# Patient Record
Sex: Male | Born: 1985 | Race: White | Hispanic: No | Marital: Married | Smoking: Former smoker
Health system: Southern US, Community
[De-identification: ages and names within clinical notes are randomized; demographics above are authoritative.]

## PROBLEM LIST (undated history)

## (undated) DIAGNOSIS — K219 Gastro-esophageal reflux disease without esophagitis: Secondary | ICD-10-CM

## (undated) HISTORY — PX: TONSILLECTOMY: SUR1361

---
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
## 2008-07-04 ENCOUNTER — Emergency Department (HOSPITAL_COMMUNITY): Admission: EM | Admit: 2008-07-04 | Discharge: 2008-07-04 | Payer: Self-pay | Admitting: Emergency Medicine

## 2008-12-04 ENCOUNTER — Emergency Department (HOSPITAL_COMMUNITY): Admission: EM | Admit: 2008-12-04 | Discharge: 2008-12-04 | Payer: Self-pay | Admitting: Emergency Medicine

## 2010-08-21 ENCOUNTER — Emergency Department (HOSPITAL_COMMUNITY)
Admission: EM | Admit: 2010-08-21 | Discharge: 2010-08-21 | Disposition: A | Discharge status: Other Acute Inpatient Hospital | Payer: Self-pay | Source: Home / Self Care | Admitting: Emergency Medicine

## 2010-08-21 ENCOUNTER — Inpatient Hospital Stay (HOSPITAL_COMMUNITY)
Admission: AD | Admit: 2010-08-21 | Discharge: 2010-08-26 | Payer: Self-pay | Source: Home / Self Care | Attending: Psychiatry | Admitting: Psychiatry

## 2010-10-01 ENCOUNTER — Emergency Department (HOSPITAL_COMMUNITY)
Admission: EM | Admit: 2010-10-01 | Discharge: 2010-10-01 | Discharge status: Against Medical Advice | Payer: Self-pay | Attending: Emergency Medicine | Admitting: Emergency Medicine

## 2010-10-01 ENCOUNTER — Emergency Department (HOSPITAL_COMMUNITY): Admit: 2010-10-01 | Payer: Self-pay

## 2010-10-01 DIAGNOSIS — T07XXXA Unspecified multiple injuries, initial encounter: Secondary | ICD-10-CM | POA: Insufficient documentation

## 2010-10-01 DIAGNOSIS — Y92009 Unspecified place in unspecified non-institutional (private) residence as the place of occurrence of the external cause: Secondary | ICD-10-CM | POA: Insufficient documentation

## 2010-10-01 DIAGNOSIS — R569 Unspecified convulsions: Secondary | ICD-10-CM | POA: Insufficient documentation

## 2010-10-01 DIAGNOSIS — W108XXA Fall (on) (from) other stairs and steps, initial encounter: Secondary | ICD-10-CM | POA: Insufficient documentation

## 2010-10-16 ENCOUNTER — Emergency Department (HOSPITAL_COMMUNITY)
Admission: EM | Admit: 2010-10-16 | Discharge: 2010-10-16 | Disposition: A | Discharge status: Home / Self Care | Payer: Self-pay | Attending: Emergency Medicine | Admitting: Emergency Medicine

## 2010-10-16 ENCOUNTER — Emergency Department (HOSPITAL_COMMUNITY): Payer: Self-pay

## 2010-10-16 DIAGNOSIS — S81009A Unspecified open wound, unspecified knee, initial encounter: Secondary | ICD-10-CM | POA: Insufficient documentation

## 2010-10-16 DIAGNOSIS — Y929 Unspecified place or not applicable: Secondary | ICD-10-CM | POA: Insufficient documentation

## 2010-10-16 DIAGNOSIS — X58XXXA Exposure to other specified factors, initial encounter: Secondary | ICD-10-CM | POA: Insufficient documentation

## 2010-10-16 DIAGNOSIS — S81809A Unspecified open wound, unspecified lower leg, initial encounter: Secondary | ICD-10-CM | POA: Insufficient documentation

## 2010-10-16 DIAGNOSIS — J45909 Unspecified asthma, uncomplicated: Secondary | ICD-10-CM | POA: Insufficient documentation

## 2010-10-16 DIAGNOSIS — L03119 Cellulitis of unspecified part of limb: Secondary | ICD-10-CM | POA: Insufficient documentation

## 2010-10-16 DIAGNOSIS — F319 Bipolar disorder, unspecified: Secondary | ICD-10-CM | POA: Insufficient documentation

## 2010-10-16 DIAGNOSIS — F411 Generalized anxiety disorder: Secondary | ICD-10-CM | POA: Insufficient documentation

## 2010-10-16 DIAGNOSIS — L02419 Cutaneous abscess of limb, unspecified: Secondary | ICD-10-CM | POA: Insufficient documentation

## 2010-10-16 DIAGNOSIS — G40909 Epilepsy, unspecified, not intractable, without status epilepticus: Secondary | ICD-10-CM | POA: Insufficient documentation

## 2010-10-16 LAB — CBC
HCT: 39.1 % (ref 39.0–52.0)
MCH: 31.3 pg (ref 26.0–34.0)
MCHC: 34.3 g/dL (ref 30.0–36.0)
MCV: 91.4 fL (ref 78.0–100.0)
Platelets: 326 10*3/uL (ref 150–400)
RDW: 12.8 % (ref 11.5–15.5)

## 2010-10-16 LAB — BASIC METABOLIC PANEL
BUN: 15 mg/dL (ref 6–23)
Creatinine, Ser: 0.95 mg/dL (ref 0.4–1.5)
GFR calc non Af Amer: >60 mL/min (ref >60)
Glucose, Bld: 85 mg/dL (ref 70–99)

## 2010-10-16 LAB — DIFFERENTIAL
Eosinophils Absolute: 0.1 10*3/uL (ref 0.0–0.7)
Eosinophils Relative: 1 % (ref 0–5)
Lymphs Abs: 2.3 10*3/uL (ref 0.7–4.0)
Monocytes Absolute: 1.1 10*3/uL — ABNORMAL HIGH (ref 0.1–1.0)
Monocytes Relative: 10 % (ref 3–12)

## 2010-11-09 LAB — COMPREHENSIVE METABOLIC PANEL
ALT: 28 U/L (ref 0–53)
AST: 35 U/L (ref 0–37)
Alkaline Phosphatase: 56 U/L (ref 39–117)
Calcium: 9.1 mg/dL (ref 8.4–10.5)
GFR calc Af Amer: >60 mL/min (ref >60)
Glucose, Bld: 86 mg/dL (ref 70–99)
Potassium: 3.6 mEq/L (ref 3.5–5.1)
Sodium: 139 mEq/L (ref 135–145)
Total Protein: 7.2 g/dL (ref 6.0–8.3)

## 2010-11-09 LAB — CBC
HCT: 41.8 % (ref 39.0–52.0)
Hemoglobin: 14.9 g/dL (ref 13.0–17.0)
MCHC: 35.6 g/dL (ref 30.0–36.0)
RDW: 12.6 % (ref 11.5–15.5)
WBC: 5.5 10*3/uL (ref 4.0–10.5)

## 2010-11-09 LAB — DIFFERENTIAL
Basophils Relative: 1 % (ref 0–1)
Eosinophils Absolute: 0.1 10*3/uL (ref 0.0–0.7)
Eosinophils Relative: 1 % (ref 0–5)
Lymphs Abs: 1.8 10*3/uL (ref 0.7–4.0)
Monocytes Absolute: 0.7 10*3/uL (ref 0.1–1.0)
Monocytes Relative: 12 % (ref 3–12)
Neutrophils Relative %: 53 % (ref 43–77)

## 2010-11-09 LAB — ETHANOL: Alcohol, Ethyl (B): <5 mg/dL (ref 0–10)

## 2010-11-09 LAB — RAPID URINE DRUG SCREEN, HOSP PERFORMED: Barbiturates: NOT DETECTED

## 2010-11-09 LAB — ACETAMINOPHEN LEVEL: Acetaminophen (Tylenol), Serum: <10 ug/mL — ABNORMAL LOW (ref 10–30)

## 2010-12-09 LAB — CBC
HCT: 47.8 % (ref 39.0–52.0)
Hemoglobin: 16.6 g/dL (ref 13.0–17.0)
RBC: 5.07 MIL/uL (ref 4.22–5.81)
RDW: 13.2 % (ref 11.5–15.5)
WBC: 17.4 10*3/uL — ABNORMAL HIGH (ref 4.0–10.5)

## 2010-12-09 LAB — URINALYSIS, ROUTINE W REFLEX MICROSCOPIC
Bilirubin Urine: NEGATIVE
Glucose, UA: NEGATIVE mg/dL
Hgb urine dipstick: NEGATIVE
Leukocytes, UA: NEGATIVE
Protein, ur: 100 mg/dL — AB
pH: 8 (ref 5.0–8.0)

## 2010-12-09 LAB — DIFFERENTIAL
Basophils Absolute: 0 10*3/uL (ref 0.0–0.1)
Eosinophils Relative: 0 % (ref 0–5)
Lymphocytes Relative: 2 % — ABNORMAL LOW (ref 12–46)
Lymphs Abs: 0.3 10*3/uL — ABNORMAL LOW (ref 0.7–4.0)
Monocytes Absolute: 0.9 10*3/uL (ref 0.1–1.0)
Neutro Abs: 16 10*3/uL — ABNORMAL HIGH (ref 1.7–7.7)

## 2010-12-09 LAB — COMPREHENSIVE METABOLIC PANEL
AST: 22 U/L (ref 0–37)
Albumin: 4 g/dL (ref 3.5–5.2)
Alkaline Phosphatase: 63 U/L (ref 39–117)
BUN: 15 mg/dL (ref 6–23)
CO2: 31 mEq/L (ref 19–32)
Chloride: 102 mEq/L (ref 96–112)
GFR calc non Af Amer: >60 mL/min (ref >60)
Potassium: 3.9 mEq/L (ref 3.5–5.1)
Total Bilirubin: 0.7 mg/dL (ref 0.3–1.2)

## 2010-12-09 LAB — URINE MICROSCOPIC-ADD ON

## 2011-02-03 ENCOUNTER — Emergency Department (HOSPITAL_COMMUNITY)
Admission: EM | Admit: 2011-02-03 | Discharge: 2011-02-03 | Disposition: A | Discharge status: Home / Self Care | Payer: Self-pay | Attending: Emergency Medicine | Admitting: Emergency Medicine

## 2011-02-03 DIAGNOSIS — R05 Cough: Secondary | ICD-10-CM | POA: Insufficient documentation

## 2011-02-03 DIAGNOSIS — J069 Acute upper respiratory infection, unspecified: Secondary | ICD-10-CM | POA: Insufficient documentation

## 2011-02-03 DIAGNOSIS — R059 Cough, unspecified: Secondary | ICD-10-CM | POA: Insufficient documentation

## 2011-02-03 DIAGNOSIS — R51 Headache: Secondary | ICD-10-CM | POA: Insufficient documentation

## 2011-02-03 DIAGNOSIS — B9789 Other viral agents as the cause of diseases classified elsewhere: Secondary | ICD-10-CM | POA: Insufficient documentation

## 2011-02-03 LAB — RAPID STREP SCREEN (MED CTR MEBANE ONLY): Streptococcus, Group A Screen (Direct): NEGATIVE

## 2011-02-05 ENCOUNTER — Emergency Department (HOSPITAL_COMMUNITY)
Admission: EM | Admit: 2011-02-05 | Discharge: 2011-02-05 | Disposition: A | Discharge status: Home / Self Care | Payer: Self-pay | Attending: Emergency Medicine | Admitting: Emergency Medicine

## 2011-02-05 DIAGNOSIS — R05 Cough: Secondary | ICD-10-CM | POA: Insufficient documentation

## 2011-02-05 DIAGNOSIS — R6889 Other general symptoms and signs: Secondary | ICD-10-CM | POA: Insufficient documentation

## 2011-02-05 DIAGNOSIS — J029 Acute pharyngitis, unspecified: Secondary | ICD-10-CM | POA: Insufficient documentation

## 2011-02-05 DIAGNOSIS — R059 Cough, unspecified: Secondary | ICD-10-CM | POA: Insufficient documentation

## 2011-03-16 ENCOUNTER — Emergency Department (HOSPITAL_COMMUNITY): Payer: Self-pay

## 2011-03-16 ENCOUNTER — Emergency Department (HOSPITAL_COMMUNITY)
Admission: EM | Admit: 2011-03-16 | Discharge: 2011-03-16 | Disposition: A | Discharge status: Home / Self Care | Payer: Self-pay | Attending: Emergency Medicine | Admitting: Emergency Medicine

## 2011-03-16 ENCOUNTER — Other Ambulatory Visit: Payer: Self-pay

## 2011-03-16 DIAGNOSIS — R112 Nausea with vomiting, unspecified: Secondary | ICD-10-CM | POA: Insufficient documentation

## 2011-03-16 DIAGNOSIS — R079 Chest pain, unspecified: Secondary | ICD-10-CM | POA: Insufficient documentation

## 2011-03-16 DIAGNOSIS — R0602 Shortness of breath: Secondary | ICD-10-CM | POA: Insufficient documentation

## 2011-03-16 LAB — CBC
Hemoglobin: 13.8 g/dL (ref 13.0–17.0)
MCH: 31.9 pg (ref 26.0–34.0)
MCV: 91.2 fL (ref 78.0–100.0)
RBC: 4.32 MIL/uL (ref 4.22–5.81)

## 2011-03-16 MED ORDER — ALUM & MAG HYDROXIDE-SIMETH 200-200-20 MG/5ML PO SUSP
30.0000 mL | Freq: Once | ORAL | Status: AC
  Administered 2011-03-16: 30 mL via ORAL
  Filled 2011-03-16: qty 30

## 2011-03-16 NOTE — ED Provider Notes (Signed)
History     Chief Complaint  Patient presents with  . Chest Pain   Patient is a 25 y.o. male presenting with chest pain. The history is provided by the patient. No language interpreter was used.  Chest Pain Episode onset: 1.5 weeks ago. Chest pain occurs constantly. The chest pain is unchanged. Associated with: nothing. The severity of the pain is mild. The quality of the pain is described as pressure-like. The pain does not radiate. Exacerbated by: nothing. Primary symptoms include shortness of breath, nausea and vomiting. Pertinent negatives for primary symptoms include no fever and no abdominal pain.  Pertinent negatives for associated symptoms include no diaphoresis, no lower extremity edema and no numbness. Associated symptoms comments: Tingling of LUE. He tried nothing for the symptoms. Risk factors: Heart disease and MI in family before age 76.   His family medical history is significant for heart disease in family and early MI in family.   C/o left sided chest pain onset 1.5 weeks ago while walking with a friend and persistent since with associated SOB and tingling in LUE with onset of chest pain which resolved after 1 day. Describes chest pain as like pressure. States SOB is worse with movement/exertion. Also states he has been vomiting upon awaking for the last 2 weeks with episodes lasting approximately 30 minutes and then resolved. Denies previous history of similar symptoms. Denies smoking. Notes family history of MI and heart disease before age 62.  Patient reports he hasn't been seen by a physician in "a while".   Patient seen at 12:51PM History reviewed. No pertinent past medical history.  History reviewed. No pertinent past surgical history.  No family history on file.  History  Substance Use Topics  . Smoking status: Not on file  . Smokeless tobacco: Not on file  . Alcohol Use: No      Review of Systems  Constitutional: Negative for fever, chills and diaphoresis.    Respiratory: Positive for shortness of breath.   Cardiovascular: Positive for chest pain.  Gastrointestinal: Positive for nausea and vomiting. Negative for abdominal pain.  Neurological: Negative for numbness.  All other systems reviewed and are negative.  All other systems negative except as noted in HPI.    Physical Exam  BP 118/67  Pulse 55  Temp(Src) 98.9 F (37.2 C) (Oral)  Resp 18  Ht 7\' 2"  (2.184 m)  Wt 220 lb (99.791 kg)  BMI 20.91 kg/m2  SpO2 97%  Physical Exam  Nursing note and vitals reviewed. Constitutional: He is oriented to person, place, and time. He appears well-developed and well-nourished. No distress.       Bradycardic.   HENT:  Head: Normocephalic and atraumatic.  Eyes: Conjunctivae are normal. No scleral icterus.  Neck: Normal range of motion. Neck supple.  Cardiovascular: Normal rate, regular rhythm, normal heart sounds, intact distal pulses and normal pulses.  Exam reveals no gallop and no friction rub.   No murmur heard. Pulmonary/Chest: Effort normal and breath sounds normal. He has no wheezes.  Abdominal: Soft. He exhibits no distension. There is no tenderness.  Musculoskeletal: Normal range of motion. He exhibits no edema and no tenderness.  Neurological: He is alert and oriented to person, place, and time. No sensory deficit.  Skin: Skin is warm and dry.  Psychiatric: He has a normal mood and affect. His behavior is normal.    ED Course  Procedures  MDM Constant CP x 1 week. Does have some risk factors. ecg with early repol. Labs  and cxr pending. Suspect noncardiac cp   Date: 03/16/2011  Rate: 55  Rhythm: normal sinus rhythm  QRS Axis: normal  Intervals: normal  ST/T Wave abnormalities: early repolarization  Conduction Disutrbances:none  Narrative Interpretation:   Old EKG Reviewed: unchanged      Chart written by Clarita Crane acting as scribe for Dr. Patria Mane I personally performed the services described in this documentation,  which was scribed in my presence. The recorded information has been reviewed and considered. Results for orders placed during the hospital encounter of 03/16/11  TROPONIN I      Component Value Range   Troponin I <0.30  <0.30 (ng/mL)  CBC      Component Value Range   WBC 4.9  4.0 - 10.5 (K/uL)   RBC 4.32  4.22 - 5.81 (MIL/uL)   Hemoglobin 13.8  13.0 - 17.0 (g/dL)   HCT 11.9  14.7 - 82.9 (%)   MCV 91.2  78.0 - 100.0 (fL)   MCH 31.9  26.0 - 34.0 (pg)   MCHC 35.0  30.0 - 36.0 (g/dL)   RDW 56.2  13.0 - 86.5 (%)   Platelets 236  150 - 400 (K/uL)     Lyanne Co, MD 03/16/11 1435

## 2011-03-16 NOTE — ED Notes (Signed)
Pt sleeping. 

## 2011-03-16 NOTE — ED Notes (Signed)
Pt states  He has had chest pain for weeks. Also weakness. States he is sleeping a lot also.

## 2011-06-07 ENCOUNTER — Encounter (HOSPITAL_COMMUNITY): Payer: Self-pay | Admitting: *Deleted

## 2011-06-07 ENCOUNTER — Emergency Department (HOSPITAL_COMMUNITY)
Admission: EM | Admit: 2011-06-07 | Discharge: 2011-06-07 | Disposition: A | Discharge status: Other Acute Inpatient Hospital | Payer: Self-pay | Attending: Emergency Medicine | Admitting: Emergency Medicine

## 2011-06-07 DIAGNOSIS — T43502A Poisoning by unspecified antipsychotics and neuroleptics, intentional self-harm, initial encounter: Secondary | ICD-10-CM | POA: Insufficient documentation

## 2011-06-07 DIAGNOSIS — T438X2A Poisoning by other psychotropic drugs, intentional self-harm, initial encounter: Secondary | ICD-10-CM | POA: Insufficient documentation

## 2011-06-07 DIAGNOSIS — T43501A Poisoning by unspecified antipsychotics and neuroleptics, accidental (unintentional), initial encounter: Secondary | ICD-10-CM | POA: Insufficient documentation

## 2011-06-07 DIAGNOSIS — T50901A Poisoning by unspecified drugs, medicaments and biological substances, accidental (unintentional), initial encounter: Secondary | ICD-10-CM

## 2011-06-07 LAB — RAPID URINE DRUG SCREEN, HOSP PERFORMED
Amphetamines: NOT DETECTED
Barbiturates: NOT DETECTED
Tetrahydrocannabinol: POSITIVE — AB

## 2011-06-07 LAB — CBC
MCH: 31.8 pg (ref 26.0–34.0)
Platelets: 210 10*3/uL (ref 150–400)
RBC: 4.31 MIL/uL (ref 4.22–5.81)
WBC: 5.5 10*3/uL (ref 4.0–10.5)

## 2011-06-07 LAB — DIFFERENTIAL
Eosinophils Absolute: 0.1 10*3/uL (ref 0.0–0.7)
Lymphocytes Relative: 32 % (ref 12–46)
Lymphs Abs: 1.8 10*3/uL (ref 0.7–4.0)
Neutro Abs: 3 10*3/uL (ref 1.7–7.7)
Neutrophils Relative %: 56 % (ref 43–77)

## 2011-06-07 LAB — URINALYSIS, ROUTINE W REFLEX MICROSCOPIC
Glucose, UA: NEGATIVE mg/dL
Leukocytes, UA: NEGATIVE
Protein, ur: NEGATIVE mg/dL
Urobilinogen, UA: 0.2 mg/dL (ref 0.0–1.0)

## 2011-06-07 LAB — BASIC METABOLIC PANEL
Chloride: 104 mEq/L (ref 96–112)
GFR calc non Af Amer: >90 mL/min (ref >90)
Glucose, Bld: 80 mg/dL (ref 70–99)
Potassium: 3.2 mEq/L — ABNORMAL LOW (ref 3.5–5.1)
Sodium: 139 mEq/L (ref 135–145)

## 2011-06-07 LAB — SALICYLATE LEVEL: Salicylate Lvl: <2 mg/dL — ABNORMAL LOW (ref 2.8–20.0)

## 2011-06-07 LAB — HEPATIC FUNCTION PANEL
Albumin: 3.9 g/dL (ref 3.5–5.2)
Bilirubin, Direct: 0.1 mg/dL (ref 0.0–0.3)
Total Bilirubin: 0.4 mg/dL (ref 0.3–1.2)

## 2011-06-07 LAB — ACETAMINOPHEN LEVEL: Acetaminophen (Tylenol), Serum: <15 ug/mL (ref 10–30)

## 2011-06-07 MED ORDER — POTASSIUM CHLORIDE CRYS ER 20 MEQ PO TBCR
60.0000 meq | EXTENDED_RELEASE_TABLET | Freq: Once | ORAL | Status: AC
  Administered 2011-06-07: 60 meq via ORAL
  Filled 2011-06-07: qty 3

## 2011-06-07 NOTE — ED Provider Notes (Signed)
12:15 PM patient alert and Lipitor Glasgow Coma Score 15 cooperative not lightheaded on standing  Doug Sou, MD 06/07/11 1235

## 2011-06-07 NOTE — ED Notes (Signed)
CALLED POISON CONTROL AND WAS INFORMED TO  ADD LFTS AND  MG LEVEL.  SUPPORTIVE CARE aND TO KEEP PATIENT ON MONITOR

## 2011-06-07 NOTE — ED Provider Notes (Addendum)
History     CSN: [id number] Arrival date & time: 06/07/2011  2:05 AM  Chief Complaint  Patient presents with  . Drug Overdose    (Consider location/radiation/quality/duration/timing/severity/associated sxs/prior treatment) HPI Comments: Seen 0209. Patient states he had an argument with his girlfriend a week ago and she moved out with his child. He has been depressed since. Went to his cousin's house and took an unknown number of pills in a bottle labeled resperdal. The medicine had belonged to a former roommate of his cousin. He states it was an attempt to kill himself. He has a h/o depression and has been under the care of [facility] in the past.  Patient is a 25 y.o. male presenting with Overdose. The history is provided by the patient (Patient despondent over recent break up with his girllfirend who took his child and moved out).  Drug Overdose This is a new (Took an unknow number of resperidal within the last 2-3 hours) problem. The current episode started 3 to 5 hours ago. Pertinent negatives include no chest pain, no abdominal pain, no headaches and no shortness of breath.    History reviewed. No pertinent past medical history.  History reviewed. No pertinent past surgical history.  No family history on file.  History  Substance Use Topics  . Smoking status: Not on file  . Smokeless tobacco: Not on file  . Alcohol Use: No      Review of Systems  Respiratory: Negative for shortness of breath.   Cardiovascular: Negative for chest pain.  Gastrointestinal: Negative for abdominal pain.  Neurological: Negative for headaches.  Psychiatric/Behavioral: Positive for suicidal ideas.       Depression  All other systems reviewed and are negative.    Allergies  Amoxapine and related; Penicillins; and Sulfa antibiotics  Home Medications  No current outpatient prescriptions on file.  BP 110/54  Pulse 84  Temp(Src) 97.6 F (36.4 C) (Oral)  Ht 6' (1.829 m)  Wt 220 lb (99.791  kg)  BMI 29.84 kg/m2  SpO2 96%  Physical Exam  Nursing note and vitals reviewed. Constitutional: He is oriented to person, place, and time. He appears well-developed and well-nourished.       Sleepy but able to answer all questions  HENT:  Head: Normocephalic and atraumatic.  Right Ear: External ear normal.  Left Ear: External ear normal.  Mouth/Throat: Oropharynx is clear and moist.  Eyes: EOM are normal. Pupils are equal, round, and reactive to light.  Neck: Normal range of motion. Neck supple.  Cardiovascular: Normal rate, normal heart sounds and intact distal pulses.   Pulmonary/Chest: Effort normal and breath sounds normal.  Abdominal: Soft.  Musculoskeletal: Normal range of motion.  Neurological: He is alert and oriented to person, place, and time. He has normal reflexes. No cranial nerve deficit. Coordination normal.  Skin: Skin is warm and dry.  Psychiatric: His speech is normal. Cognition and memory are normal. He expresses impulsivity. He exhibits a depressed mood. He expresses suicidal ideation. He expresses suicidal plans.       Flat affect.     ED Course  CRITICAL CARE Performed by: Annamarie Dawley Authorized by: Annamarie Dawley Total critical care time: 65 minutes Critical care was necessary to treat or prevent imminent or life-threatening deterioration of the following conditions: metabolic crisis. Critical care was time spent personally by me on the following activities: development of treatment plan with patient or surrogate, discussions with consultants, evaluation of patient's response to treatment, examination of patient, obtaining  history from patient or surrogate, ordering and performing treatments and interventions, ordering and review of laboratory studies, pulse oximetry, re-evaluation of patient's condition and review of old charts.   (including critical care time)  Labs Reviewed  URINE RAPID DRUG SCREEN (HOSP PERFORMED) - Abnormal; Notable for the  following:    Tetrahydrocannabinol POSITIVE (*)    All other components within normal limits  CBC  DIFFERENTIAL  BASIC METABOLIC PANEL  ETHANOL  SALICYLATE LEVEL  ACETAMINOPHEN LEVEL  MAGNESIUM  HEPATIC FUNCTION PANEL   Results for orders placed during the hospital encounter of 06/07/11  URINE RAPID DRUG SCREEN (HOSP PERFORMED)      Component Value Range   Opiates NONE DETECTED  NONE DETECTED    Cocaine NONE DETECTED  NONE DETECTED    Benzodiazepines NONE DETECTED  NONE DETECTED    Amphetamines NONE DETECTED  NONE DETECTED    Tetrahydrocannabinol POSITIVE (*) NONE DETECTED    Barbiturates NONE DETECTED  NONE DETECTED   CBC      Component Value Range   WBC 5.5  4.0 - 10.5 (K/uL)   RBC 4.31  4.22 - 5.81 (MIL/uL)   Hemoglobin 13.7  13.0 - 17.0 (g/dL)   HCT 13.0  86.5 - 78.4 (%)   MCV 91.4  78.0 - 100.0 (fL)   MCH 31.8  26.0 - 34.0 (pg)   MCHC 34.8  30.0 - 36.0 (g/dL)   RDW 69.6  29.5 - 28.4 (%)   Platelets 210  150 - 400 (K/uL)  DIFFERENTIAL      Component Value Range   Neutrophils Relative 56  43 - 77 (%)   Neutro Abs 3.0  1.7 - 7.7 (K/uL)   Lymphocytes Relative 32  12 - 46 (%)   Lymphs Abs 1.8  0.7 - 4.0 (K/uL)   Monocytes Relative 11  3 - 12 (%)   Monocytes Absolute 0.6  0.1 - 1.0 (K/uL)   Eosinophils Relative 1  0 - 5 (%)   Eosinophils Absolute 0.1  0.0 - 0.7 (K/uL)   Basophils Relative 0  0 - 1 (%)   Basophils Absolute 0.0  0.0 - 0.1 (K/uL)  BASIC METABOLIC PANEL      Component Value Range   Sodium 139  135 - 145 (mEq/L)   Potassium 3.2 (*) 3.5 - 5.1 (mEq/L)   Chloride 104  96 - 112 (mEq/L)   CO2 25  19 - 32 (mEq/L)   Glucose, Bld 80  70 - 99 (mg/dL)   BUN 11  6 - 23 (mg/dL)   Creatinine, Ser 1.32  0.50 - 1.35 (mg/dL)   Calcium 9.1  8.4 - 44.0 (mg/dL)   GFR calc non Af Amer >90  >90 (mL/min)   GFR calc Af Amer >90  >90 (mL/min)  ETHANOL      Component Value Range   Alcohol, Ethyl (B) 27 (*) 0 - 11 (mg/dL)  SALICYLATE LEVEL      Component Value Range    Salicylate Lvl <2.0 (*) 2.8 - 20.0 (mg/dL)  ACETAMINOPHEN LEVEL      Component Value Range   Acetaminophen (Tylenol), Serum <15.0  10 - 30 (ug/mL)  MAGNESIUM      Component Value Range   Magnesium 2.3  1.5 - 2.5 (mg/dL)  HEPATIC FUNCTION PANEL      Component Value Range   Total Protein 6.7  6.0 - 8.3 (g/dL)   Albumin 3.9  3.5 - 5.2 (g/dL)   AST 20  0 - 37 (U/L)  ALT 13  0 - 53 (U/L)   Alkaline Phosphatase 57  39 - 117 (U/L)   Total Bilirubin 0.4  0.3 - 1.2 (mg/dL)   Bilirubin, Direct 0.1  0.0 - 0.3 (mg/dL)   Indirect Bilirubin 0.3  0.3 - 0.9 (mg/dL)  URINALYSIS, ROUTINE W REFLEX MICROSCOPIC      Component Value Range   Color, Urine YELLOW  YELLOW    Appearance CLEAR  CLEAR    Specific Gravity, Urine <1.005 (*) 1.005 - 1.030    pH 6.0  5.0 - 8.0    Glucose, UA NEGATIVE  NEGATIVE (mg/dL)   Hgb urine dipstick NEGATIVE  NEGATIVE    Bilirubin Urine NEGATIVE  NEGATIVE    Ketones, ur NEGATIVE  NEGATIVE (mg/dL)   Protein, ur NEGATIVE  NEGATIVE (mg/dL)   Urobilinogen, UA 0.2  0.0 - 1.0 (mg/dL)   Nitrite NEGATIVE  NEGATIVE    Leukocytes, UA NEGATIVE  NEGATIVE      Date: 06/07/2011  0210  Rate: 85 Rhythm: normal sinus rhythm  QRS Axis: normal  Intervals: normal  ST/T Wave abnormalities: normal  Conduction Disutrbances:none  Narrative Interpretation:   Old EKG Reviewed: none available   0220 Patient has taken an unknown amount of resperdal in an attempt to kill himself due to a recent break up with a girlfriend. Per poison control supportive care and continuous monitoring is appropriate care. 68 Santina Evans with [facility] has interviewed the patient. Pending medical clearance for placement.0330 Patient remains stable. Up to BR without assistance. 0500 Patient taking PO fluids. No c/o VSS.0630  Patient has remained stable. VSS. 0700 Patient medically cleared to be re-evaluated by [facility] and considered for inpatient treatment. Patient continues to affirm that his intent was  to harm himself.  MDM Reviewed: previous chart, nursing note and vitals Interpretation: labs and ECG Consults: Behavioral Health.  0730 Spoke with Frances Maywood, [facility]. He will see/reevaluate patient this morning.          Nicoletta Dress. Colon Branch, MD 06/07/11 [phone]

## 2011-06-07 NOTE — ED Notes (Signed)
Pt arrived by ems with c/o overdose of ? risperdal  Unknown amt

## 2011-10-13 IMAGING — CR DG CHEST 2V
2 series · 2 of 2 positions shown · non-contrast
Comparison: 08/21/2010

CLINICAL DATA: Chest pain, fatigue, asthma, heart murmur, former
smoker

CHEST - 2 VIEW

[view not recorded (1 of 2)]
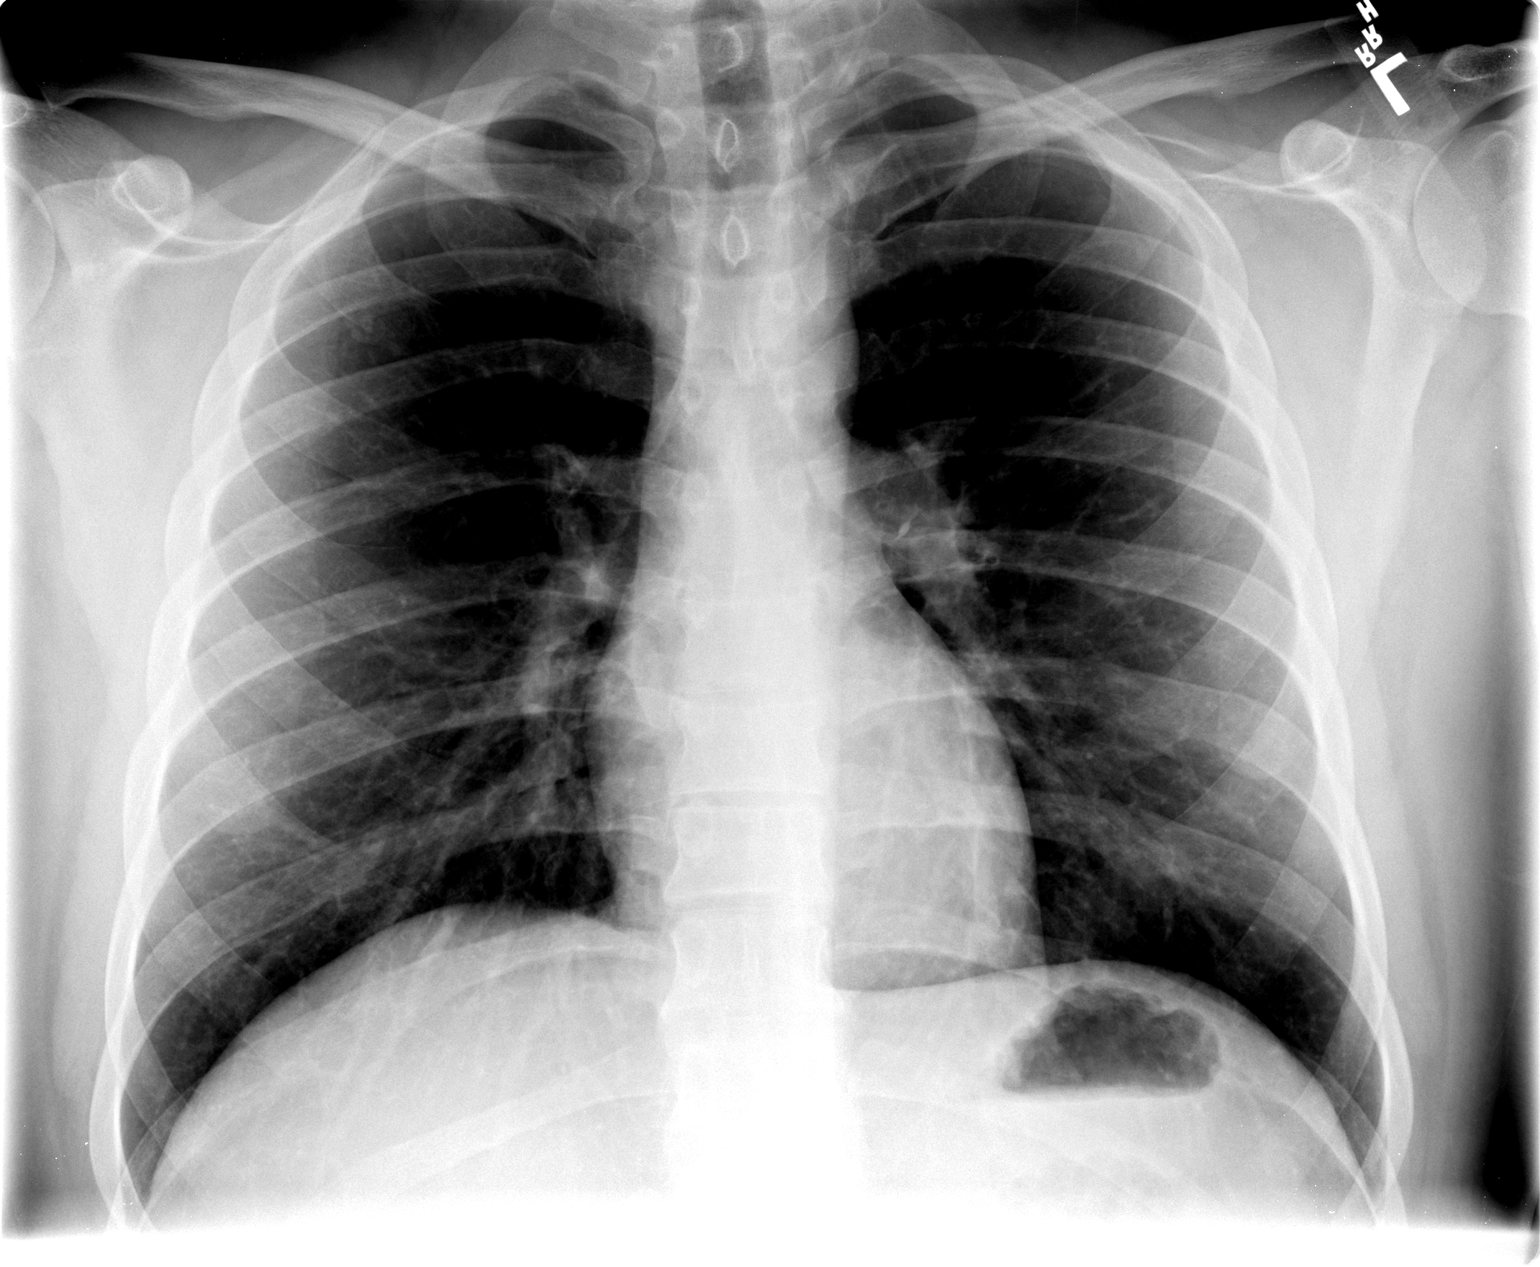

[view not recorded (2 of 2)]
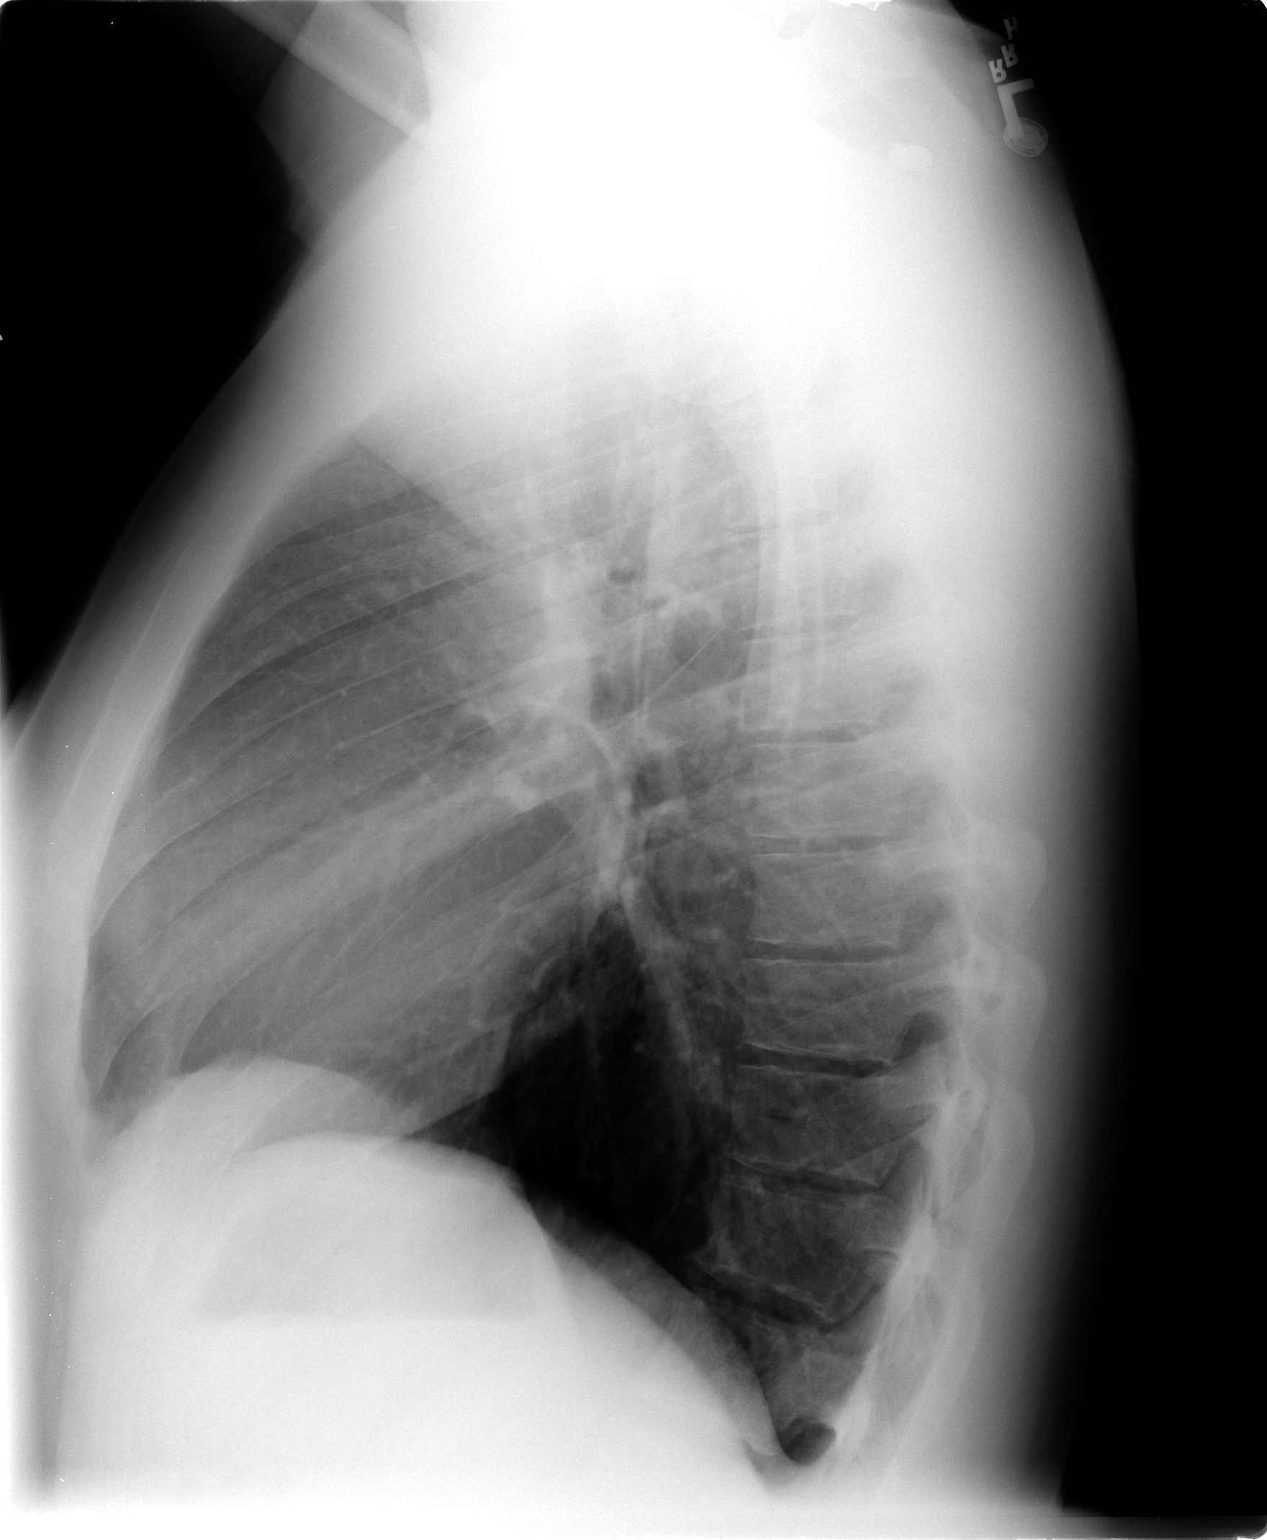

[2 of 2 positions shown; findings below may reference images not displayed]

FINDINGS: Normal heart size, mediastinal contours, and pulmonary vascularity.
Minimal peribronchial thickening.
Lungs otherwise clear.
No pleural effusion or pneumothorax.
Bones unremarkable.
IMPRESSION: Minimal bronchitic changes.

## 2011-11-21 ENCOUNTER — Emergency Department (HOSPITAL_COMMUNITY): Payer: Self-pay

## 2011-11-21 ENCOUNTER — Emergency Department (HOSPITAL_COMMUNITY)
Admission: EM | Admit: 2011-11-21 | Discharge: 2011-11-21 | Disposition: A | Discharge status: Home / Self Care | Payer: Self-pay | Attending: Emergency Medicine | Admitting: Emergency Medicine

## 2011-11-21 ENCOUNTER — Other Ambulatory Visit: Payer: Self-pay

## 2011-11-21 ENCOUNTER — Encounter (HOSPITAL_COMMUNITY): Payer: Self-pay | Admitting: Emergency Medicine

## 2011-11-21 DIAGNOSIS — R079 Chest pain, unspecified: Secondary | ICD-10-CM | POA: Insufficient documentation

## 2011-11-21 DIAGNOSIS — R109 Unspecified abdominal pain: Secondary | ICD-10-CM | POA: Insufficient documentation

## 2011-11-21 DIAGNOSIS — M545 Low back pain, unspecified: Secondary | ICD-10-CM | POA: Insufficient documentation

## 2011-11-21 DIAGNOSIS — R112 Nausea with vomiting, unspecified: Secondary | ICD-10-CM | POA: Insufficient documentation

## 2011-11-21 DIAGNOSIS — M546 Pain in thoracic spine: Secondary | ICD-10-CM | POA: Insufficient documentation

## 2011-11-21 DIAGNOSIS — R0789 Other chest pain: Secondary | ICD-10-CM

## 2011-11-21 DIAGNOSIS — R1110 Vomiting, unspecified: Secondary | ICD-10-CM

## 2011-11-21 DIAGNOSIS — R071 Chest pain on breathing: Secondary | ICD-10-CM | POA: Insufficient documentation

## 2011-11-21 LAB — DIFFERENTIAL
Basophils Absolute: 0 10*3/uL (ref 0.0–0.1)
Lymphocytes Relative: 15 % (ref 12–46)
Neutro Abs: 5.3 10*3/uL (ref 1.7–7.7)
Neutrophils Relative %: 69 % (ref 43–77)

## 2011-11-21 LAB — COMPREHENSIVE METABOLIC PANEL
ALT: 19 U/L (ref 0–53)
AST: 26 U/L (ref 0–37)
CO2: 25 mEq/L (ref 19–32)
Chloride: 103 mEq/L (ref 96–112)
GFR calc non Af Amer: >90 mL/min (ref >90)
Sodium: 138 mEq/L (ref 135–145)
Total Bilirubin: 0.6 mg/dL (ref 0.3–1.2)

## 2011-11-21 LAB — CBC
Platelets: 245 10*3/uL (ref 150–400)
RDW: 12.7 % (ref 11.5–15.5)
WBC: 7.7 10*3/uL (ref 4.0–10.5)

## 2011-11-21 MED ORDER — SODIUM CHLORIDE 0.9 % IV SOLN
INTRAVENOUS | Status: DC
  Administered 2011-11-21: 11:00:00 via INTRAVENOUS

## 2011-11-21 MED ORDER — IOHEXOL 300 MG/ML  SOLN
100.0000 mL | Freq: Once | INTRAMUSCULAR | Status: AC | PRN
  Administered 2011-11-21: 100 mL via INTRAVENOUS

## 2011-11-21 MED ORDER — FENTANYL CITRATE 0.05 MG/ML IJ SOLN
100.0000 ug | Freq: Once | INTRAMUSCULAR | Status: AC
  Administered 2011-11-21: 100 ug via INTRAVENOUS
  Filled 2011-11-21: qty 2

## 2011-11-21 MED ORDER — ONDANSETRON HCL 4 MG/2ML IJ SOLN
4.0000 mg | Freq: Once | INTRAMUSCULAR | Status: AC
  Administered 2011-11-21: 4 mg via INTRAVENOUS
  Filled 2011-11-21: qty 2

## 2011-11-21 MED ORDER — SODIUM CHLORIDE 0.9 % IV SOLN
80.0000 mg | Freq: Once | INTRAVENOUS | Status: AC
  Administered 2011-11-21: 80 mg via INTRAVENOUS
  Filled 2011-11-21: qty 80
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
## 2011-11-23 NOTE — ED Provider Notes (Signed)
Medical screening examination/treatment/procedure(s) were performed by non-physician practitioner and as supervising physician I was immediately available for consultation/collaboration.   Forbes Cellar, MD 11/23/11 1731

## 2014-09-02 ENCOUNTER — Encounter (HOSPITAL_COMMUNITY): Payer: Self-pay | Admitting: *Deleted

## 2014-09-02 ENCOUNTER — Emergency Department (HOSPITAL_COMMUNITY): Payer: Self-pay

## 2014-09-02 ENCOUNTER — Emergency Department (HOSPITAL_COMMUNITY)
Admission: EM | Admit: 2014-09-02 | Discharge: 2014-09-02 | Disposition: A | Discharge status: Home / Self Care | Payer: Self-pay | Attending: Emergency Medicine | Admitting: Emergency Medicine

## 2014-09-02 DIAGNOSIS — S61551A Open bite of right wrist, initial encounter: Secondary | ICD-10-CM | POA: Insufficient documentation

## 2014-09-02 DIAGNOSIS — T1490XA Injury, unspecified, initial encounter: Secondary | ICD-10-CM

## 2014-09-02 DIAGNOSIS — W540XXA Bitten by dog, initial encounter: Secondary | ICD-10-CM | POA: Insufficient documentation

## 2014-09-02 DIAGNOSIS — Y9389 Activity, other specified: Secondary | ICD-10-CM | POA: Insufficient documentation

## 2014-09-02 DIAGNOSIS — S61552A Open bite of left wrist, initial encounter: Secondary | ICD-10-CM

## 2014-09-02 DIAGNOSIS — Z23 Encounter for immunization: Secondary | ICD-10-CM | POA: Insufficient documentation

## 2014-09-02 DIAGNOSIS — Y998 Other external cause status: Secondary | ICD-10-CM | POA: Insufficient documentation

## 2014-09-02 DIAGNOSIS — Y929 Unspecified place or not applicable: Secondary | ICD-10-CM | POA: Insufficient documentation

## 2014-09-02 DIAGNOSIS — Z87891 Personal history of nicotine dependence: Secondary | ICD-10-CM | POA: Insufficient documentation

## 2014-09-02 DIAGNOSIS — Z880 Allergy status to penicillin: Secondary | ICD-10-CM | POA: Insufficient documentation

## 2014-09-02 MED ORDER — DICLOFENAC SODIUM 75 MG PO TBEC: 75.0000 mg | DELAYED_RELEASE_TABLET | Freq: Two times a day (BID) | ORAL | Status: DC

## 2014-09-02 MED ORDER — TETANUS-DIPHTH-ACELL PERTUSSIS 5-2.5-18.5 LF-MCG/0.5 IM SUSP
0.5000 mL | Freq: Once | INTRAMUSCULAR | Status: AC
  Administered 2014-09-02: 0.5 mL via INTRAMUSCULAR
  Filled 2014-09-02: qty 0.5

## 2014-09-02 MED ORDER — DOXYCYCLINE HYCLATE 100 MG PO TABS
100.0000 mg | ORAL_TABLET | Freq: Once | ORAL | Status: AC
  Administered 2014-09-02: 100 mg via ORAL
  Filled 2014-09-02: qty 1

## 2014-09-02 MED ORDER — DOXYCYCLINE HYCLATE 100 MG PO CAPS: 100.0000 mg | ORAL_CAPSULE | Freq: Two times a day (BID) | ORAL | Status: DC

## 2014-09-02 NOTE — Discharge Instructions (Signed)
Please cleanse the wounds to your hand and wrist with soap and water daily. Please apply a Band-Aid to the area until healed. Please use diclofenac and doxycycline 2 times daily with food until all taken. Please see your primary physician or return to the emergency department if any red streaks of the hand, excessive swelling, fever, or deterioration in your general condition. Animal Bite Animal bite wounds can get infected. It is important to get proper medical treatment. Ask your doctor if you need a rabies shot. HOME CARE   Follow your doctor's instructions for taking care of your wound.  Only take medicine as told by your doctor.  Take your medicine (antibiotics) as told. Finish them even if you start to feel better.  Keep all doctor visits as told. You may need a tetanus shot if:   You cannot remember when you had your last tetanus shot.  You have never had a tetanus shot.  The injury broke your skin. If you need a tetanus shot and you choose not to have one, you may get tetanus. Sickness from tetanus can be serious. GET HELP RIGHT AWAY IF:   Your wound is warm, red, sore, or puffy (swollen).  You notice yellowish-white fluid (pus) or a bad smell coming from the wound.  You see a red line on the skin coming from the wound.  You have a fever, chills, or you feel sick.  You feel sick to your stomach (nauseous), or you throw up (vomit).  Your pain does not go away, or it gets worse.  You have trouble moving the injured part.  You have questions or concerns. MAKE SURE YOU:   Understand these instructions.  Will watch your condition.  Will get help right away if you are not doing well or get worse. Document Released: 08/16/2005 Document Revised: 11/08/2011 Document Reviewed: 04/07/2011 [facility] Patient Information 2015 [place], [place]. This information is not intended to replace advice given to you by your health care provider. Make sure you discuss any questions you have  with your health care provider.

## 2014-09-02 NOTE — ED Provider Notes (Signed)
CSN: [id number]     Arrival date & time 09/02/14  1140 History  This chart was scribed for non-physician practitioner, Ivery Quale, PA-C working with Juliet Rude. Rubin Payor, MD by Gwenyth Ober, ED scribe. This patient was seen in room APFT20/APFT20 and the patient's care was started at 2:52 PM   Chief Complaint  Patient presents with  . Animal Bite   Patient is a 29 y.o. male presenting with animal bite. The history is provided by the patient. No language interpreter was used.  Animal Bite Contact animal:  Dog Location:  Hand Hand injury location:  L hand, L wrist and L palm Time since incident:  1 day Incident location:  Outside Notifications:  None Animal's rabies vaccination status:  Unknown Animal in possession: no   Tetanus status:  Out of date Relieved by:  None tried Worsened by:  Nothing tried Ineffective treatments:  None tried Associated symptoms: no fever, no numbness, no rash and no swelling     HPI Comments: Brendan Wilkinson is a 29 y.o. male who presents to the Emergency Department complaining of constant left hand pain after a dog bite that occurred yesterday. Pt states he was breaking up a dog fight and was bit by a pit bull. Pt reports that he was petting a pit bull when his smaller dog became jealous and the dogs fought. He does not know the owner of the dog and has not followed up with Animal Control. Pt notes his last tetanus was 6 years ago.   History reviewed. No pertinent past medical history. Past Surgical History  Procedure Laterality Date  . Tonsillectomy     History reviewed. No pertinent family history. History  Substance Use Topics  . Smoking status: Former Games developer  . Smokeless tobacco: Former Neurosurgeon    Types: Chew  . Alcohol Use: No    Review of Systems  Constitutional: Negative for fever.  Musculoskeletal: Positive for arthralgias. Negative for joint swelling.  Skin: Positive for wound. Negative for rash.  Neurological: Negative for numbness.   All other systems reviewed and are negative.     Allergies  Amoxapine and related; Penicillins; and Sulfa antibiotics  Home Medications   Prior to Admission medications   Not on File   BP 127/67 mmHg  Pulse 65  Temp(Src) 98.7 F (37.1 C) (Oral)  Resp 18  Ht  (1.854 m)  Wt 220 lb (99.791 kg)  BMI 29.03 kg/m2  SpO2 100% Physical Exam  Constitutional: He is oriented to person, place, and time. He appears well-developed and well-nourished. No distress.  HENT:  Head: Normocephalic and atraumatic.  Mouth/Throat: Oropharynx is clear and moist. No oropharyngeal exudate.  Eyes: Pupils are equal, round, and reactive to light.  Neck: Neck supple.  Cardiovascular: Normal rate.   Pulmonary/Chest: Effort normal.  Musculoskeletal: He exhibits no edema.  Puncture wounds to dorsum of left wrist; full ROM of fingers; full ROM of hand; full ROM of elbow; shallow puncture wounds to the thenar eminence of the left hand; shallow puncture wound to palmar surface of left wrist; no hot joint  Neurological: He is alert and oriented to person, place, and time. No cranial nerve deficit.  Skin: Skin is warm and dry. No rash noted.  Psychiatric: He has a normal mood and affect. His behavior is normal.  Nursing note and vitals reviewed.   ED Course  Procedures (including critical care time) DIAGNOSTIC STUDIES: Oxygen Saturation is 100% on RA, normal by my interpretation.  COORDINATION OF CARE: 3:10 PM Discussed treatment plan with pt at bedside and pt agreed to plan.  3:19 PM Discussed need for Tetanus. Discussed the need for anti-biotic. Discussed the need that either dog that bit him to be quarantined or he start the rabies vaccinations. Pt declined rabies vaccinations and he declined to have dog caught and/or observed. He acknowledges understanding of the consequences of this action and he accepts the consequences  Labs Review Labs Reviewed - No data to display  Imaging Review Dg  Wrist Complete Left  09/02/2014   CLINICAL DATA:  Initial encounter. Posterior radial sided wrist pain. Metacarpal pain. Animal bite.  EXAM: LEFT WRIST - COMPLETE 3+ VIEW  COMPARISON:  None.  FINDINGS: Anatomic alignment of the RIGHT wrist. There is no fracture. Soft tissue swelling is present over the dorsum of the wrist. Scaphoid bone intact. Carpal alignment is normal.  IMPRESSION: Negative.   Electronically Signed   By: Andreas Newport M.D.   On: 09/02/2014 12:34     EKG Interpretation None      MDM  Animal bite to the left hand and wrist. No bone or tendon involvement. Xray neg for fb or fx. Rx for doxycycline and diclofenac given to the patient. I encouraged pt to return if he changes his mind about rabies vac, or having the dog observed. Pt acknowledges understanding of d/c instructions.   Final diagnoses:  Injury    *I have reviewed nursing notes, vital signs, and all appropriate lab and imaging results for this patient.** **I personally performed the services described in this documentation, which was scribed in my presence. The recorded information has been reviewed and is accurate.Kathie Dike, PA-C 09/06/14 1711  Juliet Rude. Rubin Payor, MD 09/06/14 2356

## 2014-09-02 NOTE — ED Notes (Addendum)
Dog bite to lt hand /wrist, injury yesterday by 2 different dogs when breaking up a fight.  Injury was in [facility] limits

## 2018-09-07 ENCOUNTER — Ambulatory Visit: Payer: Self-pay | Admitting: Physician Assistant

## 2018-09-20 ENCOUNTER — Encounter: Payer: Self-pay | Admitting: Physician Assistant

## 2019-03-30 ENCOUNTER — Other Ambulatory Visit: Payer: Self-pay

## 2019-03-30 DIAGNOSIS — Z20822 Contact with and (suspected) exposure to covid-19: Secondary | ICD-10-CM

## 2019-04-01 LAB — NOVEL CORONAVIRUS, NAA: SARS-CoV-2, NAA: NOT DETECTED

## 2019-06-20 ENCOUNTER — Other Ambulatory Visit: Payer: Self-pay | Admitting: *Deleted

## 2019-06-20 DIAGNOSIS — Z20822 Contact with and (suspected) exposure to covid-19: Secondary | ICD-10-CM

## 2019-06-21 LAB — NOVEL CORONAVIRUS, NAA: SARS-CoV-2, NAA: NOT DETECTED

## 2020-06-01 ENCOUNTER — Other Ambulatory Visit: Payer: Self-pay

## 2020-06-01 ENCOUNTER — Ambulatory Visit (INDEPENDENT_AMBULATORY_CARE_PROVIDER_SITE_OTHER): Payer: Medicaid - Out of State

## 2020-06-01 ENCOUNTER — Ambulatory Visit
Admission: EM | Admit: 2020-06-01 | Discharge: 2020-06-01 | Disposition: A | Discharge status: Home / Self Care | Payer: Medicaid - Out of State | Attending: Emergency Medicine | Admitting: Emergency Medicine

## 2020-06-01 DIAGNOSIS — M898X1 Other specified disorders of bone, shoulder: Secondary | ICD-10-CM

## 2020-06-01 DIAGNOSIS — M25511 Pain in right shoulder: Secondary | ICD-10-CM | POA: Diagnosis not present

## 2020-06-01 DIAGNOSIS — S4991XA Unspecified injury of right shoulder and upper arm, initial encounter: Secondary | ICD-10-CM | POA: Diagnosis not present

## 2020-06-01 MED ORDER — NAPROXEN 500 MG PO TABS: 500.0000 mg | ORAL_TABLET | Freq: Two times a day (BID) | ORAL | 0 refills | Status: DC

## 2020-06-01 NOTE — ED Triage Notes (Signed)
Pt presents with right shoulder pain from [employer] injury

## 2020-06-01 NOTE — ED Provider Notes (Signed)
[facility]   [id number] 06/01/20 Arrival Time: 1534  CC: RT clavicle pain  SUBJECTIVE: History from: patient. Brendan Wilkinson is a 34 y.o. male complains of RT clavicle pain x 1.5 hours.  Fall off skateboard landing on RT shoulder.  Localizes the pain to the distal clavicle.  Describes the pain as constant and sharp in character.  Has NOT tried OTC medications.  Symptoms are made worse with ROM.  Denies similar symptoms in the past.  Denies fever, chills, erythema, ecchymosis, effusion, weakness, numbness and tingling.  ROS: As per HPI.  All other pertinent ROS negative.     History reviewed. No pertinent past medical history. Past Surgical History:  Procedure Laterality Date  . TONSILLECTOMY     Allergies  Allergen Reactions  . Amoxapine And Related Other (See Comments)    Reaction=unknown  . Penicillins Other (See Comments)    Reaction=unknown  . Sulfa Antibiotics Other (See Comments)    Reaction=unknown   No current facility-administered medications on file prior to encounter.   Current Outpatient Medications on File Prior to Encounter  Medication Sig Dispense Refill  . diclofenac (VOLTAREN) 75 MG EC tablet Take 1 tablet (75 mg total) by mouth 2 (two) times daily. 14 tablet 0   Social History   Socioeconomic History  . Marital status: Married    Spouse name: Not on file  . Number of children: Not on file  . Years of education: Not on file  . Highest education level: Not on file  Occupational History  . Not on file  Tobacco Use  . Smoking status: Former Games developer  . Smokeless tobacco: Former Neurosurgeon    Types: Chew  Substance and Sexual Activity  . Alcohol use: No  . Drug use: No  . Sexual activity: Not on file  Other Topics Concern  . Not on file  Social History Narrative  . Not on file   Social Determinants of Health   Financial Resource Strain:   . Difficulty of Paying Living Expenses: Not on file  Food Insecurity:   . Worried About Patent examiner in the Last Year: Not on file  . Ran Out of Food in the Last Year: Not on file  Transportation Needs:   . Lack of Transportation (Medical): Not on file  . Lack of Transportation (Non-Medical): Not on file  Physical Activity:   . Days of Exercise per Week: Not on file  . Minutes of Exercise per Session: Not on file  Stress:   . Feeling of Stress : Not on file  Social Connections:   . Frequency of Communication with Friends and Family: Not on file  . Frequency of Social Gatherings with Friends and Family: Not on file  . Attends Religious Services: Not on file  . Active Member of Clubs or Organizations: Not on file  . Attends Banker Meetings: Not on file  . Marital Status: Not on file  Intimate Partner Violence:   . Fear of Current or Ex-Partner: Not on file  . Emotionally Abused: Not on file  . Physically Abused: Not on file  . Sexually Abused: Not on file   History reviewed. No pertinent family history.  OBJECTIVE:  Vitals:   06/01/20 1540  BP: (!) 145/107  Pulse: 73  Resp: 17  Temp: 98.2 F (36.8 C)  TempSrc: Oral  SpO2: 96%    General appearance: ALERT; in no acute distress.  Head: NCAT Lungs: Normal respiratory effort CV: Radial pulse  2+ Musculoskeletal: RT shoulder/ clavicle Inspection: Skin warm, dry, clear and intact without obvious erythema, effusion, or ecchymosis.  Palpation: TTP over distal clavicle ROM: 0-160 with shoulder Strength: deferred Skin: warm and dry Neurologic: Ambulates without difficulty; Sensation intact about the upper extremities Psychological: alert and cooperative; normal mood and affect  DIAGNOSTIC STUDIES:  DG Clavicle Right  Result Date: 06/01/2020 CLINICAL DATA:  Skate board accident rt clavicle pain EXAM: RIGHT CLAVICLE - 2+ VIEWS COMPARISON:  Chest radiograph 11/21/2011 FINDINGS: There is no evidence of fracture or other focal bone lesions. Soft tissues are unremarkable. IMPRESSION: Negative. Electronically  Signed   By: Emmaline Kluver M.D.   On: 06/01/2020 15:47     X-rays negative for bony abnormalities including fracture, or dislocation.  No soft tissue swelling.    I have reviewed the x-rays myself and the radiologist interpretation. I am in agreement with the radiologist interpretation.     ASSESSMENT & PLAN:  1. Injury of right shoulder, initial encounter   2. Clavicle pain    Meds ordered this encounter  Medications  . DISCONTD: naproxen (NAPROSYN) 500 MG tablet    Sig: Take 1 tablet (500 mg total) by mouth 2 (two) times daily.    Dispense:  30 tablet    Refill:  0    Order Specific Question:   Supervising Provider    Answer:   Eustace Moore [[id number]]  . naproxen (NAPROSYN) 500 MG tablet    Sig: Take 1 tablet (500 mg total) by mouth 2 (two) times daily.    Dispense:  30 tablet    Refill:  0    Order Specific Question:   Supervising Provider    Answer:   Eustace Moore [[id number]]   X-rays negative Continue conservative management of rest, ice, and gentle stretches Take naproxen as needed for pain relief (may cause abdominal discomfort, ulcers, and GI bleeds avoid taking with other NSAIDs) Follow up with PCP if symptoms persist Return or go to the ER if you have any new or worsening symptoms (fever, chills, chest pain, redness, swelling, bruising, deformity etc...)   Reviewed expectations re: course of current medical issues. Questions answered. Outlined signs and symptoms indicating need for more acute intervention. Patient verbalized understanding. After Visit Summary given.    Rennis Harding, PA-C 06/01/20 1557

## 2020-06-01 NOTE — Discharge Instructions (Signed)
X-rays negative Continue conservative management of rest, ice, and gentle stretches Take naproxen as needed for pain relief (may cause abdominal discomfort, ulcers, and GI bleeds avoid taking with other NSAIDs) Follow up with PCP if symptoms persist Return or go to the ER if you have any new or worsening symptoms (fever, chills, chest pain, redness, swelling, bruising, deformity etc...)

## 2020-09-17 ENCOUNTER — Other Ambulatory Visit: Payer: Self-pay

## 2020-09-17 ENCOUNTER — Ambulatory Visit
Admission: EM | Admit: 2020-09-17 | Discharge: 2020-09-17 | Disposition: A | Discharge status: Home / Self Care | Payer: BLUE CROSS/BLUE SHIELD | Attending: Emergency Medicine | Admitting: Emergency Medicine

## 2020-09-17 ENCOUNTER — Encounter: Payer: Self-pay | Admitting: Emergency Medicine

## 2020-09-17 DIAGNOSIS — J069 Acute upper respiratory infection, unspecified: Secondary | ICD-10-CM

## 2020-09-17 DIAGNOSIS — R509 Fever, unspecified: Secondary | ICD-10-CM | POA: Diagnosis not present

## 2020-09-17 DIAGNOSIS — Z1152 Encounter for screening for COVID-19: Secondary | ICD-10-CM

## 2020-09-17 MED ORDER — CETIRIZINE HCL 10 MG PO TABS: 10.0000 mg | ORAL_TABLET | Freq: Every day | ORAL | 0 refills | Status: DC

## 2020-09-17 MED ORDER — PREDNISONE 10 MG PO TABS: 20.0000 mg | ORAL_TABLET | Freq: Every day | ORAL | 0 refills | Status: DC

## 2020-09-17 MED ORDER — BENZONATATE 100 MG PO CAPS: 100.0000 mg | ORAL_CAPSULE | Freq: Three times a day (TID) | ORAL | 0 refills | Status: DC | PRN

## 2020-09-17 MED ORDER — FLUTICASONE PROPIONATE 50 MCG/ACT NA SUSP: 1.0000 | Freq: Every day | NASAL | 0 refills | Status: DC

## 2020-09-17 NOTE — ED Triage Notes (Signed)
Fever, cough chills and body ache x 2 days

## 2020-09-17 NOTE — Discharge Instructions (Signed)
COVID testing ordered.  It will take between 2-7 days for test results.  Someone will contact you regarding abnormal results.    Get plenty of rest and push fluids Tessalon Perles prescribed for cough Zyrtec for nasal congestion, runny nose, and/or sore throat Flonase for nasal congestion and runny nose Prednisone was prescribed Use medications daily for symptom relief Use OTC medications like ibuprofen or tylenol as needed fever or pain Call or go to the ED if you have any new or worsening symptoms such as fever, worsening cough, shortness of breath, chest tightness, chest pain, turning blue, changes in mental status, etc... 

## 2020-09-17 NOTE — ED Provider Notes (Signed)
[facility]   [id number] 09/17/20 Arrival Time: 1058   CC: COVID symptoms  SUBJECTIVE: History from: family, patient  KACE HARTJE is a 35 y.o. male who presented to the urgent care for complaint of chills and fever, cough and body ache for the past 2 days.  Has sibling with same URI symptoms.  Denies recent travel.  Has tried OTC medication without relief.  Denies alleviating or aggravating factors.  Denies previous symptoms in the past.   Denies  fatigue, sinus pain, rhinorrhea, sore throat, SOB, wheezing, chest pain, nausea, changes in bowel or bladder habits.      ROS: As per HPI.  All other pertinent ROS negative.     History reviewed. No pertinent past medical history. Past Surgical History:  Procedure Laterality Date  . TONSILLECTOMY     Allergies  Allergen Reactions  . Amoxapine And Related Other (See Comments)    Reaction=unknown  . Penicillins Other (See Comments)    Reaction=unknown  . Sulfa Antibiotics Other (See Comments)    Reaction=unknown   No current facility-administered medications on file prior to encounter.   Current Outpatient Medications on File Prior to Encounter  Medication Sig Dispense Refill  . diclofenac (VOLTAREN) 75 MG EC tablet Take 1 tablet (75 mg total) by mouth 2 (two) times daily. 14 tablet 0  . naproxen (NAPROSYN) 500 MG tablet Take 1 tablet (500 mg total) by mouth 2 (two) times daily. 30 tablet 0   Social History   Socioeconomic History  . Marital status: Married    Spouse name: Not on file  . Number of children: Not on file  . Years of education: Not on file  . Highest education level: Not on file  Occupational History  . Not on file  Tobacco Use  . Smoking status: Former Games developer  . Smokeless tobacco: Former Neurosurgeon    Types: Chew  Substance and Sexual Activity  . Alcohol use: No  . Drug use: No  . Sexual activity: Not on file  Other Topics Concern  . Not on file  Social History Narrative  . Not on file    Social Determinants of Health   Financial Resource Strain: Not on file  Food Insecurity: Not on file  Transportation Needs: Not on file  Physical Activity: Not on file  Stress: Not on file  Social Connections: Not on file  Intimate Partner Violence: Not on file   History reviewed. No pertinent family history.  OBJECTIVE:  Vitals:   09/17/20 1216  BP: 114/71  Pulse: 89  Resp: 18  Temp: 98 F (36.7 C)  TempSrc: Oral  SpO2: 98%  Weight: 254 lb (115.2 kg)  Height: 6\' 1"  (1.854 m)     General appearance: alert; appears fatigued, but nontoxic; speaking in full sentences and tolerating own secretions HEENT: NCAT; Ears: EACs clear, TMs pearly gray; Eyes: PERRL.  EOM grossly intact. Sinuses: nontender; Nose: nares patent without rhinorrhea, Throat: oropharynx clear, tonsils non erythematous or enlarged, uvula midline  Neck: supple without LAD Lungs: unlabored respirations, symmetrical air entry; cough: moderate; no respiratory distress; CTAB Heart: regular rate and rhythm.  Radial pulses 2+ symmetrical bilaterally Skin: warm and dry Psychological: alert and cooperative; normal mood and affect  LABS:  No results found for this or any previous visit (from the past 24 hour(s)).   ASSESSMENT & PLAN:  1. Fever and chills   2. Viral URI with cough   3. Encounter for screening for COVID-19  Meds ordered this encounter  Medications  . benzonatate (TESSALON) 100 MG capsule    Sig: Take 1 capsule (100 mg total) by mouth 3 (three) times daily as needed for cough.    Dispense:  30 capsule    Refill:  0  . fluticasone (FLONASE) 50 MCG/ACT nasal spray    Sig: Place 1 spray into both nostrils daily for 14 days.    Dispense:  16 g    Refill:  0  . cetirizine (ZYRTEC ALLERGY) 10 MG tablet    Sig: Take 1 tablet (10 mg total) by mouth daily.    Dispense:  30 tablet    Refill:  0  . predniSONE (DELTASONE) 10 MG tablet    Sig: Take 2 tablets (20 mg total) by mouth daily.     Dispense:  15 tablet    Refill:  0    Discharge instructions  COVID testing ordered.  It will take between 2-7 days for test results.  Someone will contact you regarding abnormal results.    Get plenty of rest and push fluids Tessalon Perles prescribed for cough Zyrtec for nasal congestion, runny nose, and/or sore throat Flonase for nasal congestion and runny nose Prednisone was prescribed Use medications daily for symptom relief Use OTC medications like ibuprofen or tylenol as needed fever or pain Call or go to the ED if you have any new or worsening symptoms such as fever, worsening cough, shortness of breath, chest tightness, chest pain, turning blue, changes in mental status, etc...   Reviewed expectations re: course of current medical issues. Questions answered. Outlined signs and symptoms indicating need for more acute intervention. Patient verbalized understanding. After Visit Summary given.          Durward Parcel, FNP 09/17/20 1314

## 2020-09-19 LAB — COVID-19, FLU A+B NAA
Influenza A, NAA: NOT DETECTED
Influenza B, NAA: NOT DETECTED
SARS-CoV-2, NAA: DETECTED — AB

## 2021-08-12 ENCOUNTER — Encounter (HOSPITAL_COMMUNITY): Payer: Self-pay | Admitting: *Deleted

## 2021-08-12 ENCOUNTER — Other Ambulatory Visit: Payer: Self-pay

## 2021-08-12 ENCOUNTER — Emergency Department (HOSPITAL_COMMUNITY)
Admission: EM | Admit: 2021-08-12 | Discharge: 2021-08-13 | Disposition: A | Discharge status: Home / Self Care | Payer: Medicaid - Out of State | Attending: Emergency Medicine | Admitting: Emergency Medicine

## 2021-08-12 ENCOUNTER — Emergency Department (HOSPITAL_COMMUNITY): Payer: Medicaid - Out of State

## 2021-08-12 DIAGNOSIS — Z87891 Personal history of nicotine dependence: Secondary | ICD-10-CM | POA: Diagnosis not present

## 2021-08-12 DIAGNOSIS — R509 Fever, unspecified: Secondary | ICD-10-CM | POA: Diagnosis not present

## 2021-08-12 DIAGNOSIS — R112 Nausea with vomiting, unspecified: Secondary | ICD-10-CM | POA: Insufficient documentation

## 2021-08-12 DIAGNOSIS — J029 Acute pharyngitis, unspecified: Secondary | ICD-10-CM | POA: Diagnosis not present

## 2021-08-12 DIAGNOSIS — R109 Unspecified abdominal pain: Secondary | ICD-10-CM | POA: Insufficient documentation

## 2021-08-12 DIAGNOSIS — F1290 Cannabis use, unspecified, uncomplicated: Secondary | ICD-10-CM | POA: Diagnosis not present

## 2021-08-12 LAB — COMPREHENSIVE METABOLIC PANEL
ALT: 25 U/L (ref 0–44)
AST: 28 U/L (ref 15–41)
Albumin: 4.8 g/dL (ref 3.5–5.0)
Alkaline Phosphatase: 56 U/L (ref 38–126)
Anion gap: 15 (ref 5–15)
BUN: 14 mg/dL (ref 6–20)
CO2: 22 mmol/L (ref 22–32)
Calcium: 9.6 mg/dL (ref 8.9–10.3)
Chloride: 97 mmol/L — ABNORMAL LOW (ref 98–111)
Creatinine, Ser: 1.04 mg/dL (ref 0.61–1.24)
GFR, Estimated: >60 mL/min (ref >60)
Glucose, Bld: 121 mg/dL — ABNORMAL HIGH (ref 70–99)
Potassium: 3.5 mmol/L (ref 3.5–5.1)
Sodium: 134 mmol/L — ABNORMAL LOW (ref 135–145)
Total Bilirubin: 1.8 mg/dL — ABNORMAL HIGH (ref 0.3–1.2)
Total Protein: 8.9 g/dL — ABNORMAL HIGH (ref 6.5–8.1)

## 2021-08-12 LAB — CBC
HCT: 46 % (ref 39.0–52.0)
Hemoglobin: 16.2 g/dL (ref 13.0–17.0)
MCH: 31.8 pg (ref 26.0–34.0)
MCHC: 35.2 g/dL (ref 30.0–36.0)
MCV: 90.4 fL (ref 80.0–100.0)
Platelets: 355 10*3/uL (ref 150–400)
RBC: 5.09 MIL/uL (ref 4.22–5.81)
RDW: 12.2 % (ref 11.5–15.5)
WBC: 17.5 10*3/uL — ABNORMAL HIGH (ref 4.0–10.5)
nRBC: 0 % (ref 0.0–0.2)

## 2021-08-12 LAB — LIPASE, BLOOD: Lipase: 23 U/L (ref 11–51)

## 2021-08-12 MED ORDER — SODIUM CHLORIDE 0.9 % IV BOLUS
1000.0000 mL | Freq: Once | INTRAVENOUS | Status: AC
  Administered 2021-08-12: 23:00:00 1000 mL via INTRAVENOUS

## 2021-08-12 MED ORDER — IOHEXOL 300 MG/ML  SOLN
100.0000 mL | Freq: Once | INTRAMUSCULAR | Status: AC | PRN
Start: 2021-08-12 — End: 2021-08-12
  Administered 2021-08-12: 23:00:00 100 mL via INTRAVENOUS

## 2021-08-12 MED ORDER — ONDANSETRON HCL 4 MG/2ML IJ SOLN
4.0000 mg | Freq: Once | INTRAMUSCULAR | Status: AC
  Administered 2021-08-12: 23:00:00 4 mg via INTRAVENOUS
  Filled 2021-08-12: qty 2

## 2021-08-12 MED ORDER — MORPHINE SULFATE (PF) 2 MG/ML IV SOLN
4.0000 mg | Freq: Once | INTRAVENOUS | Status: AC
  Administered 2021-08-12: 23:00:00 4 mg via INTRAVENOUS
  Filled 2021-08-12: qty 2

## 2021-08-12 NOTE — ED Notes (Signed)
Patient transported to CT 

## 2021-08-12 NOTE — ED Provider Notes (Signed)
Care assumed from [facility], patient with fever, abdominal pain and vomiting, workup pending.  WBC is moderately elevated, mild hyponatremia present.  CT scan shows no acute findings.  Patient continues to have severe abdominal pain requiring large doses of narcotics.  He got good pain relief with hydromorphone.  Labs are significant for borderline hyponatremia, not felt to be clinically significant.  Patient was reexamined, abdomen is soft and nontender.  He states he has been having pain like this for 2 years and has not seen a gastroenterologist.  He will clearly need GI evaluation.  He is referred to gastroenterology for outpatient work-up.  He is given a take-home pack of hydrocodone-acetaminophen for pain control, but no further outpatient prescriptions are given today.  Results for orders placed or performed during the hospital encounter of 08/12/21  Lipase, blood  Result Value Ref Range   Lipase 23 11 - 51 U/L  Comprehensive metabolic panel  Result Value Ref Range   Sodium 134 (L) 135 - 145 mmol/L   Potassium 3.5 3.5 - 5.1 mmol/L   Chloride 97 (L) 98 - 111 mmol/L   CO2 22 22 - 32 mmol/L   Glucose, Bld 121 (H) 70 - 99 mg/dL   BUN 14 6 - 20 mg/dL   Creatinine, Ser 1.95 0.61 - 1.24 mg/dL   Calcium 9.6 8.9 - 09.3 mg/dL   Total Protein 8.9 (H) 6.5 - 8.1 g/dL   Albumin 4.8 3.5 - 5.0 g/dL   AST 28 15 - 41 U/L   ALT 25 0 - 44 U/L   Alkaline Phosphatase 56 38 - 126 U/L   Total Bilirubin 1.8 (H) 0.3 - 1.2 mg/dL   GFR, Estimated >26 >71 mL/min   Anion gap 15 5 - 15  CBC  Result Value Ref Range   WBC 17.5 (H) 4.0 - 10.5 K/uL   RBC 5.09 4.22 - 5.81 MIL/uL   Hemoglobin 16.2 13.0 - 17.0 g/dL   HCT 24.5 80.9 - 98.3 %   MCV 90.4 80.0 - 100.0 fL   MCH 31.8 26.0 - 34.0 pg   MCHC 35.2 30.0 - 36.0 g/dL   RDW 38.2 50.5 - 39.7 %   Platelets 355 150 - 400 K/uL   nRBC 0.0 0.0 - 0.2 %   CT ABDOMEN PELVIS W CONTRAST  Result Date: 08/12/2021 CLINICAL DATA:  Abdominal pain, fever, vomiting EXAM:  CT ABDOMEN AND PELVIS WITH CONTRAST TECHNIQUE: Multidetector CT imaging of the abdomen and pelvis was performed using the standard protocol following bolus administration of intravenous contrast. CONTRAST:  OMNIPAQUE IOHEXOL 300 MG/ML  SOLN COMPARISON:  11/21/2011 FINDINGS: Lower chest: Lung bases are clear. Hepatobiliary: Liver is within normal limits. Gallbladder is unremarkable. No intrahepatic or extrahepatic ductal dilatation. Pancreas: Within normal limits. Spleen: Within normal limits. Adrenals/Urinary Tract: Adrenal glands are within normal limits. Kidneys are within normal limits.  No hydronephrosis. Bladder is within normal limits. Stomach/Bowel: Stomach is within normal limits. No evidence of bowel obstruction. Normal appendix (series 2/image 69). No colonic wall thickening or inflammatory changes. Vascular/Lymphatic: Aneurysm No suspicious abdominopelvic lymphadenopathy. Reproductive: Prostate is unremarkable. Other: No abdominopelvic ascites. Minimal fat in the right inguinal canal. Musculoskeletal: Visualized osseous structures are within normal limits. IMPRESSION: Negative CT abdomen/pelvis. Electronically Signed   By: Charline Bills M.D.   On: 08/12/2021 23:40    Images viewed by me.    Dione Booze, MD 08/13/21 [phone]

## 2021-08-12 NOTE — ED Triage Notes (Signed)
Pt with fever yesterday, denies today.  Woke up with severe abd pain and HA with emesis.  Not able to keep anything down.

## 2021-08-12 NOTE — ED Provider Notes (Signed)
[facility] EMERGENCY DEPARTMENT Provider Note   CSN: [id number] Arrival date & time: 08/12/21  2151     History Chief Complaint  Patient presents with   Abdominal Pain    Brendan Wilkinson is a 35 y.o. male.  35 year old male presents today for evaluation of abdominal pain onset this morning.  He reports he had fever and sore throat yesterday with resolution of fever today, however he woke up with severe abdominal pain that has been constant at a level of 5/10 with bouts of sharp intense pains at a level of 10/10.  He reports last night he also became nauseous and has been vomiting since.  He reports multiple episodes of vomiting throughout the day and has not been able to keep any food or drink down. He states his wife earlier this evening mentioned his vomit was blood-tinged.  He denies any significant use of ibuprofen or other NSAID products recently.  He denies hematuria, hematochezia or melanotic stools.  He endorses marijuana use but denies other illicit drug use, or alcohol use.  The history is provided by the patient. No language interpreter was used.      History reviewed. No pertinent past medical history.  There are no problems to display for this patient.   Past Surgical History:  Procedure Laterality Date   TONSILLECTOMY         History reviewed. No pertinent family history.  Social History   Tobacco Use   Smoking status: Former   Smokeless tobacco: Former    Types: Chew  Substance Use Topics   Alcohol use: No   Drug use: No    Home Medications Prior to Admission medications   Medication Sig Start Date End Date Taking? Authorizing Provider  benzonatate (TESSALON) 100 MG capsule Take 1 capsule (100 mg total) by mouth 3 (three) times daily as needed for cough. 09/17/20   Avegno, Darrelyn Hillock, FNP  cetirizine (ZYRTEC ALLERGY) 10 MG tablet Take 1 tablet (10 mg total) by mouth daily. 09/17/20   Avegno, Darrelyn Hillock, FNP  diclofenac (VOLTAREN) 75 MG EC tablet  Take 1 tablet (75 mg total) by mouth 2 (two) times daily. 09/02/14   Lily Kocher, PA-C  fluticasone (FLONASE) 50 MCG/ACT nasal spray Place 1 spray into both nostrils daily for 14 days. 09/17/20 10/01/20  Avegno, Darrelyn Hillock, FNP  naproxen (NAPROSYN) 500 MG tablet Take 1 tablet (500 mg total) by mouth 2 (two) times daily. 06/01/20   Wurst, [place], PA-C  predniSONE (DELTASONE) 10 MG tablet Take 2 tablets (20 mg total) by mouth daily. 09/17/20   Avegno, Darrelyn Hillock, FNP    Allergies    Amoxapine and related, Penicillins, and Sulfa antibiotics  Review of Systems   Review of Systems  Constitutional:  Positive for chills and fever.  HENT:  Positive for sore throat.   Respiratory:  Negative for shortness of breath.   Cardiovascular:  Negative for chest pain.  Gastrointestinal:  Positive for abdominal pain, nausea and vomiting.  Genitourinary:  Negative for difficulty urinating.  Neurological:  Negative for weakness and light-headedness.  All other systems reviewed and are negative.  Physical Exam Updated Vital Signs BP 135/83    Pulse (!) 108    Temp 97.7 F (36.5 C) (Oral)    Resp (!) 24    Ht 6' (1.829 m)    Wt 117.9 kg    SpO2 98%    BMI 35.26 kg/m   Physical Exam Vitals and nursing note reviewed.  Constitutional:  General: He is not in acute distress.    Appearance: Normal appearance. He is not ill-appearing.  HENT:     Head: Normocephalic and atraumatic.     Nose: Nose normal.  Eyes:     General: No scleral icterus.    Extraocular Movements: Extraocular movements intact.     Conjunctiva/sclera: Conjunctivae normal.  Cardiovascular:     Rate and Rhythm: Normal rate and regular rhythm.     Pulses: Normal pulses.     Heart sounds: Normal heart sounds.  Pulmonary:     Effort: Pulmonary effort is normal. No respiratory distress.     Breath sounds: Normal breath sounds. No wheezing or rales.  Abdominal:     General: There is no distension.     Palpations: Abdomen is soft.      Tenderness: There is no abdominal tenderness. There is right CVA tenderness and left CVA tenderness. There is no guarding.  Musculoskeletal:        General: Normal range of motion.     Cervical back: Normal range of motion.  Skin:    General: Skin is warm and dry.  Neurological:     General: No focal deficit present.     Mental Status: He is alert. Mental status is at baseline.    ED Results / Procedures / Treatments   Labs (all labs ordered are listed, but only abnormal results are displayed) Labs Reviewed  RESP PANEL BY RT-PCR (FLU A&B, COVID) ARPGX2  LIPASE, BLOOD  COMPREHENSIVE METABOLIC PANEL  CBC  URINALYSIS, ROUTINE W REFLEX MICROSCOPIC    EKG None  Radiology No results found.  Procedures Procedures   Medications Ordered in ED Medications  morphine 2 MG/ML injection 4 mg (has no administration in time range)  ondansetron (ZOFRAN) injection 4 mg (has no administration in time range)  sodium chloride 0.9 % bolus 1,000 mL (has no administration in time range)    ED Course  I have reviewed the triage vital signs and the nursing notes.  Pertinent labs & imaging results that were available during my care of the patient were reviewed by me and considered in my medical decision making (see chart for details).    MDM Rules/Calculators/A&P                           35 year old male presents today for evaluation of severe abdominal pain onset this morning associated with nausea vomiting with inability to tolerate p.o. intake.  Patient does endorse fever and sore throat yesterday.  States he took a at home COVID test which was negative.  Patient's abdomen is benign but he does have bilateral flank pain worse on the right.  Appears uncomfortable on exam.  states his abdominal pain is internal.  He did mention as the day went on today he did start dry heaving and his wife mentioned earlier this evening that his emesis may have been blood-tinged. Will obtain labs, CT abdomen  pelvis, respiratory panel, and give Zofran and morphine.  At the end of my shift patient only patient CBC is back which shows leukocytosis of 17.5.  Patient on exam reports significant improvement of his abdominal pain following pain medication and appears more comfortable.  Patient signed out to Dr. Preston Fleeting for follow-up on labs and imaging and appropriate disposition.  Final Clinical Impression(s) / ED Diagnoses Final diagnoses:  None    Rx / DC Orders ED Discharge Orders     None  Evlyn Courier, PA-C 08/12/21 2321    Davonna Belling, MD 08/13/21 [id number]

## 2021-08-13 LAB — URINALYSIS, ROUTINE W REFLEX MICROSCOPIC
Bilirubin Urine: NEGATIVE
Glucose, UA: NEGATIVE mg/dL
Ketones, ur: >80 mg/dL — AB
Leukocytes,Ua: NEGATIVE
Nitrite: NEGATIVE
Protein, ur: NEGATIVE mg/dL
Specific Gravity, Urine: <1.005 — ABNORMAL LOW (ref 1.005–1.030)
pH: 6 (ref 5.0–8.0)

## 2021-08-13 LAB — URINALYSIS, MICROSCOPIC (REFLEX)
Bacteria, UA: NONE SEEN
Squamous Epithelial / HPF: NONE SEEN (ref 0–5)

## 2021-08-13 MED ORDER — MORPHINE SULFATE (PF) 4 MG/ML IV SOLN
4.0000 mg | Freq: Once | INTRAVENOUS | Status: AC
  Administered 2021-08-13: 4 mg via INTRAVENOUS
  Filled 2021-08-13: qty 1

## 2021-08-13 MED ORDER — HYDROMORPHONE HCL 1 MG/ML IJ SOLN
1.0000 mg | Freq: Once | INTRAMUSCULAR | Status: AC
  Administered 2021-08-13: 1 mg via INTRAVENOUS
  Filled 2021-08-13: qty 1

## 2021-08-13 MED ORDER — HYDROCODONE-ACETAMINOPHEN 5-325 MG PO TABS: 1.0000 | ORAL_TABLET | ORAL | 0 refills | Status: DC | PRN

## 2021-08-13 NOTE — Discharge Instructions (Addendum)
The cause for your pain was not clear on the CT scan or blood work.  Please follow-up with a gastroenterologist for further evaluation to try to find out what is causing your pain so appropriate treatment can be instituted.  Return to the emergency department if you are having any problems.

## 2021-08-17 MED FILL — Hydrocodone-Acetaminophen Tab 5-325 MG: ORAL | Qty: 6 | Status: AC

## 2021-09-03 ENCOUNTER — Other Ambulatory Visit: Payer: Self-pay

## 2021-09-03 ENCOUNTER — Encounter (INDEPENDENT_AMBULATORY_CARE_PROVIDER_SITE_OTHER): Payer: Self-pay | Admitting: Gastroenterology

## 2021-09-03 ENCOUNTER — Encounter (INDEPENDENT_AMBULATORY_CARE_PROVIDER_SITE_OTHER): Payer: Self-pay

## 2021-09-03 ENCOUNTER — Ambulatory Visit (INDEPENDENT_AMBULATORY_CARE_PROVIDER_SITE_OTHER): Payer: Medicaid - Out of State | Admitting: Gastroenterology

## 2021-09-03 ENCOUNTER — Other Ambulatory Visit (INDEPENDENT_AMBULATORY_CARE_PROVIDER_SITE_OTHER): Payer: Self-pay

## 2021-09-03 VITALS — BP 113/76 | HR 88 | Temp 99.0°F | Ht 72.0 in | Wt 254.8 lb

## 2021-09-03 DIAGNOSIS — R1013 Epigastric pain: Secondary | ICD-10-CM

## 2021-09-03 DIAGNOSIS — R197 Diarrhea, unspecified: Secondary | ICD-10-CM | POA: Diagnosis not present

## 2021-09-03 DIAGNOSIS — K219 Gastro-esophageal reflux disease without esophagitis: Secondary | ICD-10-CM | POA: Diagnosis not present

## 2021-09-03 DIAGNOSIS — R110 Nausea: Secondary | ICD-10-CM

## 2021-09-03 MED ORDER — SUCRALFATE 1 G PO TABS: 1.0000 g | ORAL_TABLET | Freq: Three times a day (TID) | ORAL | 1 refills | Status: DC

## 2021-09-03 MED ORDER — PANTOPRAZOLE SODIUM 40 MG PO TBEC: 40.0000 mg | DELAYED_RELEASE_TABLET | Freq: Every day | ORAL | 1 refills | Status: DC

## 2021-09-03 NOTE — H&P (View-Only) (Signed)
Referring Provider: No ref. provider found Primary Care Physician:  Patient, No Pcp Per (Inactive) Primary GI Physician: new  Chief Complaint  Patient presents with   Follow-up    Patient here today for a Ed follow up from 08/12/2021, due to Epigastric abdominal pain. Patient state the pain comes and goes since then.  He states if he does not eat he gets a lot of gas. Most mornings he feels nauseous.   HPI:   Brendan Wilkinson is a 36 y.o. male with no significant past medical history.  Patient presenting today as a new patient for ED visit follow up  Patient presented to ED on 08/12/21 with c/o abdominal pain with nausea and vomiting. CT A/P done during ED visit was unremarkable. WBC 17.5, T bili 1.8, lipase 23.patient did report that he had some blood tinged emesis with browning pink emesis. He reports that prior to going to the ED he felt febrile 1-2 days before.   Patient states that for the past 2 years he has upper abdominal and nausea, a lot of dry heaving. He does endorse issues with acid reflux, states that he has taken  7-14 day otc PPI treatment. He also endorses some bloating in his abdomen. He has tried IB gard which helped some but when he stops the  medication, pain and nausea return. He reports that acid reflux occurs fairly often but I worse 1-2x/month. He does have some issues with acid regurgitation and belching as well. He tries to avoid gluten as he feels that this worsens his symptoms, dairy also seems to worsen his symptoms. Acidic foods also worsen his symptoms. He endorses 3-4 softer stools per day. Has occasional lower abdominal pain, especially if he has eaten dairy, sometimes pain is improved with having a BM. Denies rectal bleeding or melena. Sometimes eating improves his upper abdominal pain.   NSAID use: no NSAID use Social hx: occasional etoh, marijuana use Fam hx: no CRC or liver disease  Last Colonoscopy:never Last Endoscopy:never  History reviewed. No  pertinent past medical history.  Past Surgical History:  Procedure Laterality Date   TONSILLECTOMY      No current outpatient medications on file.   No current facility-administered medications for this visit.    Allergies as of 09/03/2021 - Review Complete 09/03/2021  Allergen Reaction Noted   Amoxapine and related Other (See Comments) 03/16/2011   Penicillins Other (See Comments) 03/16/2011   Sulfa antibiotics Other (See Comments) 03/16/2011    History reviewed. No pertinent family history.  Social History   Socioeconomic History   Marital status: Married    Spouse name: Not on file   Number of children: Not on file   Years of education: Not on file   Highest education level: Not on file  Occupational History   Not on file  Tobacco Use   Smoking status: Former   Smokeless tobacco: Former    Types: Nurse, children's Use: Never used  Substance and Sexual Activity   Alcohol use: No   Drug use: Yes    Types: Marijuana   Sexual activity: Not on file  Other Topics Concern   Not on file  Social History Narrative   Not on file   Social Determinants of Health   Financial Resource Strain: Not on file  Food Insecurity: Not on file  Transportation Needs: Not on file  Physical Activity: Not on file  Stress: Not on file  Social Connections: Not on file  Review of systems General: negative for malaise, night sweats, fever, chills, weight loss Neck: Negative for lumps, goiter, pain and significant neck swelling Resp: Negative for cough, wheezing, dyspnea at rest CV: Negative for chest pain, leg swelling, palpitations, orthopnea GI: denies melena, hematochezia, diarrhea, constipation, dysphagia, odyonophagia, early satiety or unintentional weight loss. +nausea +epigastric pain +vomiting +reflux +belching MSK: Negative for joint pain or swelling, back pain, and muscle pain. Derm: Negative for itching or rash Psych: Denies depression, anxiety, memory loss,  confusion. No homicidal or suicidal ideation.  Heme: Negative for prolonged bleeding, bruising easily, and swollen nodes. Endocrine: Negative for cold or heat intolerance, polyuria, polydipsia and goiter. Neuro: negative for tremor, gait imbalance, syncope and seizures. The remainder of the review of systems is noncontributory.  Physical Exam: BP 113/76 (BP Location: Right Arm, Patient Position: Sitting, Cuff Size: Large)    Pulse 88    Temp 99 F (37.2 C) (Oral)    Ht 6' (1.829 m)    Wt 254 lb 12.8 oz (115.6 kg)    BMI 34.56 kg/m  General:   Alert and oriented. No distress noted. Pleasant and cooperative.  Head:  Normocephalic and atraumatic. Eyes:  Conjuctiva clear without scleral icterus. Mouth:  Oral mucosa pink and moist. Good dentition. No lesions. Heart: Normal rate and rhythm, s1 and s2 heart sounds present.  Lungs: Clear lung sounds in all lobes. Respirations equal and unlabored. Abdomen:  +BS, soft, mild TTP of epigastric region and non-distended. No rebound or guarding. No HSM or masses noted. Derm: No palmar erythema or jaundice Msk:  Symmetrical without gross deformities. Normal posture. Extremities:  Without edema. Neurologic:  Alert and  oriented x4 Psych:  Alert and cooperative. Normal mood and affect.  Invalid input(s): 6 MONTHS   ASSESSMENT: Brendan Wilkinson is a 36 y.o. male presenting today as new patient for ED follow up, with upper abdominal pain, nausea and vomiting.  patient has nausea/vomiting, epigastric pain with frequent acid reflux, with improvement in symptoms while on short course of otc PPI/ IB gard. We cannot rule out gastritis, esophagitis, PUD or even just severe uncontrolled GERD. We will proceed with EGD for further evaluation. In the meantime will initiate PPI therapy once daily and add 1g carafate (tablets prescribed for cost effectiveness, patient instructed on dissolving tablet in 1 oz water to make slurry he can drink) to help with symptomatic  relief. Reassuringly he has no red flag symptoms.   Given his frequent softer stools and occasional lower abdominal pain (chronic) I suspect there is some underlying IBS ashe is sensitive to both lactose and gluten, we will do celiac panel to rule out true allergy to gluten, though he should continue to avoid triggers. He will make me aware if he develops any new or worsening GI symptoms, including, but not limited to, melena, rectal bleeding, hematemesis, worsening abdominal pain or diarrhea.  PLAN:  Pantoprazole 40mg  once daily 2. Carafate 1g QID (tablet to be dissolved in 1 oz water) 3. Schedule EGD 4. Celiac panel   Follow Up: TBD after EGD  Jayshon Dommer L. Alver Sorrow, MSN, APRN, AGNP-C Adult-Gerontology Nurse Practitioner [facility]

## 2021-09-03 NOTE — Progress Notes (Signed)
° °Referring Provider: No ref. provider found °Primary Care Physician:  Patient, No Pcp Per (Inactive) °Primary GI Physician: new ° °Chief Complaint  °Patient presents with  ° Follow-up  °  Patient here today for a Ed follow up from 08/12/2021, due to Epigastric abdominal pain. Patient state the pain comes and goes since then.  He states if he does not eat he gets a lot of gas. Most mornings he feels nauseous.  ° °HPI:   °Brendan Wilkinson is a 36 y.o. male with no significant past medical history. ° °Patient presenting today as a new patient for ED visit follow up ° °Patient presented to ED on 08/12/21 with c/o abdominal pain with nausea and vomiting. CT A/P done during ED visit was unremarkable. WBC 17.5, T bili 1.8, lipase 23.patient did report that he had some blood tinged emesis with browning pink emesis. He reports that prior to going to the ED he felt febrile 1-2 days before.  ° °Patient states that for the past 2 years he has upper abdominal and nausea, a lot of dry heaving. He does endorse issues with acid reflux, states that he has taken  7-14 day otc PPI treatment. He also endorses some bloating in his abdomen. He has tried IB gard which helped some but when he stops the  medication, pain and nausea return. He reports that acid reflux occurs fairly often but I worse 1-2x/month. He does have some issues with acid regurgitation and belching as well. He tries to avoid gluten as he feels that this worsens his symptoms, dairy also seems to worsen his symptoms. Acidic foods also worsen his symptoms. He endorses 3-4 softer stools per day. Has occasional lower abdominal pain, especially if he has eaten dairy, sometimes pain is improved with having a BM. Denies rectal bleeding or melena. Sometimes eating improves his upper abdominal pain.  ° °NSAID use: no NSAID use °Social hx: occasional etoh, marijuana use °Fam hx: no CRC or liver disease ° °Last Colonoscopy:never °Last Endoscopy:never ° °History reviewed. No  pertinent past medical history. ° °Past Surgical History:  °Procedure Laterality Date  ° TONSILLECTOMY    ° ° °No current outpatient medications on file.  ° °No current facility-administered medications for this visit.  ° ° °Allergies as of 09/03/2021 - Review Complete 09/03/2021  °Allergen Reaction Noted  ° Amoxapine and related Other (See Comments) 03/16/2011  ° Penicillins Other (See Comments) 03/16/2011  ° Sulfa antibiotics Other (See Comments) 03/16/2011  ° ° °History reviewed. No pertinent family history. ° °Social History  ° °Socioeconomic History  ° Marital status: Married  °  Spouse name: Not on file  ° Number of children: Not on file  ° Years of education: Not on file  ° Highest education level: Not on file  °Occupational History  ° Not on file  °Tobacco Use  ° Smoking status: Former  ° Smokeless tobacco: Former  °  Types: Chew  °Vaping Use  ° Vaping Use: Never used  °Substance and Sexual Activity  ° Alcohol use: No  ° Drug use: Yes  °  Types: Marijuana  ° Sexual activity: Not on file  °Other Topics Concern  ° Not on file  °Social History Narrative  ° Not on file  ° °Social Determinants of Health  ° °Financial Resource Strain: Not on file  °Food Insecurity: Not on file  °Transportation Needs: Not on file  °Physical Activity: Not on file  °Stress: Not on file  °Social Connections: Not on file  ° °  Review of systems °General: negative for malaise, night sweats, fever, chills, weight loss °Neck: Negative for lumps, goiter, pain and significant neck swelling °Resp: Negative for cough, wheezing, dyspnea at rest °CV: Negative for chest pain, leg swelling, palpitations, orthopnea °GI: denies melena, hematochezia, diarrhea, constipation, dysphagia, odyonophagia, early satiety or unintentional weight loss. +nausea +epigastric pain +vomiting +reflux +belching °MSK: Negative for joint pain or swelling, back pain, and muscle pain. °Derm: Negative for itching or rash °Psych: Denies depression, anxiety, memory loss,  confusion. No homicidal or suicidal ideation.  °Heme: Negative for prolonged bleeding, bruising easily, and swollen nodes. °Endocrine: Negative for cold or heat intolerance, polyuria, polydipsia and goiter. °Neuro: negative for tremor, gait imbalance, syncope and seizures. °The remainder of the review of systems is noncontributory. ° °Physical Exam: °BP 113/76 (BP Location: Right Arm, Patient Position: Sitting, Cuff Size: Large)    Pulse 88    Temp 99 °F (37.2 °C) (Oral)    Ht 6' (1.829 m)    Wt 254 lb 12.8 oz (115.6 kg)    BMI 34.56 kg/m²  °General:   Alert and oriented. No distress noted. Pleasant and cooperative.  °Head:  Normocephalic and atraumatic. °Eyes:  Conjuctiva clear without scleral icterus. °Mouth:  Oral mucosa pink and moist. Good dentition. No lesions. °Heart: Normal rate and rhythm, s1 and s2 heart sounds present.  °Lungs: Clear lung sounds in all lobes. Respirations equal and unlabored. °Abdomen:  +BS, soft, mild TTP of epigastric region and non-distended. No rebound or guarding. No HSM or masses noted. °Derm: No palmar erythema or jaundice °Msk:  Symmetrical without gross deformities. Normal posture. °Extremities:  Without edema. °Neurologic:  Alert and  oriented x4 °Psych:  Alert and cooperative. Normal mood and affect. ° °Invalid input(s): 6 MONTHS  ° °ASSESSMENT: °[name] is a 36 y.o. male presenting today as new patient for ED follow up, with upper abdominal pain, nausea and vomiting. ° °patient has nausea/vomiting, epigastric pain with frequent acid reflux, with improvement in symptoms while on short course of otc PPI/ IB gard. We cannot rule out gastritis, esophagitis, PUD or even just severe uncontrolled GERD. We will proceed with EGD for further evaluation. In the meantime will initiate PPI therapy once daily and add 1g carafate (tablets prescribed for cost effectiveness, patient instructed on dissolving tablet in 1 oz water to make slurry he can drink) to help with symptomatic  relief. Reassuringly he has no red flag symptoms.  ° °Given his frequent softer stools and occasional lower abdominal pain (chronic) I suspect there is some underlying IBS ashe is sensitive to both lactose and gluten, we will do celiac panel to rule out true allergy to gluten, though he should continue to avoid triggers. He will make me aware if he develops any new or worsening GI symptoms, including, but not limited to, melena, rectal bleeding, hematemesis, worsening abdominal pain or diarrhea. ° °PLAN:  °Pantoprazole 40mg once daily °2. Carafate 1g QID (tablet to be dissolved in 1 oz water) °3. Schedule EGD °4. Celiac panel ° ° °Follow Up: °TBD after EGD ° °Edy Mcbane L. Nychelle Cassata, MSN, APRN, AGNP-C °Adult-Gerontology Nurse Practitioner °[facility] ° °

## 2021-09-03 NOTE — Patient Instructions (Signed)
We will get you scheduled for an upper endoscopy for further evaluation of your symptoms I have sent pantoprazole 40mg  to your pharmacy, take this every morning 30-45 minutes prior to eating I have also sent carafate 1g tablets, as discussed, please dissolve tablet in 1 oz of water and mix then drink prior to meals and at bedtime. Please avoid foods that seem to cause worsening symptoms such as dairy, gluten, and greasy, spicy foods, chocolate and caffeine can also worsen reflux related issues. We will also perform celiac testing to rule out celiac disease.

## 2021-09-04 LAB — CELIAC DISEASE PANEL
(tTG) Ab, IgA: <1 U/mL
(tTG) Ab, IgG: <1 U/mL
Gliadin IgA: 1.7 U/mL
Gliadin IgG: <1 U/mL
Immunoglobulin A: 317 mg/dL — ABNORMAL HIGH (ref 47–310)

## 2021-09-11 ENCOUNTER — Other Ambulatory Visit: Payer: Self-pay

## 2021-09-11 ENCOUNTER — Encounter (HOSPITAL_COMMUNITY)
Admission: RE | Disposition: A | Discharge status: Home / Self Care | Payer: Self-pay | Source: Ambulatory Visit | Attending: Gastroenterology

## 2021-09-11 ENCOUNTER — Ambulatory Visit (HOSPITAL_COMMUNITY): Payer: Medicaid - Out of State | Admitting: Anesthesiology

## 2021-09-11 ENCOUNTER — Encounter (HOSPITAL_COMMUNITY): Payer: Self-pay | Admitting: Gastroenterology

## 2021-09-11 ENCOUNTER — Ambulatory Visit (HOSPITAL_COMMUNITY)
Admission: RE | Admit: 2021-09-11 | Discharge: 2021-09-11 | Disposition: A | Discharge status: Home / Self Care | Payer: Medicaid - Out of State | Source: Ambulatory Visit | Attending: Gastroenterology | Admitting: Gastroenterology

## 2021-09-11 DIAGNOSIS — R110 Nausea: Secondary | ICD-10-CM | POA: Diagnosis not present

## 2021-09-11 DIAGNOSIS — R1013 Epigastric pain: Secondary | ICD-10-CM

## 2021-09-11 DIAGNOSIS — K449 Diaphragmatic hernia without obstruction or gangrene: Secondary | ICD-10-CM | POA: Diagnosis not present

## 2021-09-11 DIAGNOSIS — F1290 Cannabis use, unspecified, uncomplicated: Secondary | ICD-10-CM | POA: Diagnosis not present

## 2021-09-11 DIAGNOSIS — Z79899 Other long term (current) drug therapy: Secondary | ICD-10-CM | POA: Diagnosis not present

## 2021-09-11 DIAGNOSIS — Z6834 Body mass index (BMI) 34.0-34.9, adult: Secondary | ICD-10-CM | POA: Insufficient documentation

## 2021-09-11 DIAGNOSIS — R112 Nausea with vomiting, unspecified: Secondary | ICD-10-CM | POA: Insufficient documentation

## 2021-09-11 DIAGNOSIS — Z87891 Personal history of nicotine dependence: Secondary | ICD-10-CM | POA: Insufficient documentation

## 2021-09-11 DIAGNOSIS — K219 Gastro-esophageal reflux disease without esophagitis: Secondary | ICD-10-CM | POA: Insufficient documentation

## 2021-09-11 HISTORY — PX: ESOPHAGOGASTRODUODENOSCOPY (EGD) WITH PROPOFOL: SHX5813

## 2021-09-11 HISTORY — PX: BIOPSY: SHX5522

## 2021-09-11 HISTORY — DX: Gastro-esophageal reflux disease without esophagitis: K21.9

## 2021-09-11 SURGERY — ESOPHAGOGASTRODUODENOSCOPY (EGD) WITH PROPOFOL
Anesthesia: General

## 2021-09-11 MED ORDER — PROPOFOL 1000 MG/100ML IV EMUL
INTRAVENOUS | Status: AC
  Filled 2021-09-11: qty 100

## 2021-09-11 MED ORDER — LACTATED RINGERS IV SOLN: INTRAVENOUS | Status: DC

## 2021-09-11 MED ORDER — PROPOFOL 10 MG/ML IV BOLUS
INTRAVENOUS | Status: DC | PRN
  Administered 2021-09-11: 60 mg via INTRAVENOUS

## 2021-09-11 MED ORDER — PROPOFOL 500 MG/50ML IV EMUL
INTRAVENOUS | Status: DC | PRN
  Administered 2021-09-11: 150 ug/kg/min via INTRAVENOUS

## 2021-09-11 NOTE — Anesthesia Preprocedure Evaluation (Signed)
Anesthesia Evaluation  Patient identified by MRN, date of birth, ID band Patient awake    Reviewed: Allergy & Precautions, H&P , NPO status , Patient's Chart, lab work & pertinent test results, reviewed documented beta blocker date and time   Airway Mallampati: II  TM Distance: >3 FB Neck ROM: full    Dental no notable dental hx.    Pulmonary neg pulmonary ROS, former smoker,    Pulmonary exam normal breath sounds clear to auscultation       Cardiovascular Exercise Tolerance: Good negative cardio ROS   Rhythm:regular Rate:Normal     Neuro/Psych negative neurological ROS  negative psych ROS   GI/Hepatic Neg liver ROS, GERD  Medicated,  Endo/Other  Morbid obesity  Renal/GU negative Renal ROS  negative genitourinary   Musculoskeletal   Abdominal   Peds  Hematology negative hematology ROS (+)   Anesthesia Other Findings   Reproductive/Obstetrics negative OB ROS                             Anesthesia Physical Anesthesia Plan  ASA: 2  Anesthesia Plan: General   Post-op Pain Management:    Induction:   PONV Risk Score and Plan: Propofol infusion  Airway Management Planned:   Additional Equipment:   Intra-op Plan:   Post-operative Plan:   Informed Consent: I have reviewed the patients History and Physical, chart, labs and discussed the procedure including the risks, benefits and alternatives for the proposed anesthesia with the patient or authorized representative who has indicated his/her understanding and acceptance.     Dental Advisory Given  Plan Discussed with: CRNA  Anesthesia Plan Comments:         Anesthesia Quick Evaluation

## 2021-09-11 NOTE — Discharge Instructions (Addendum)
You are being discharged to home.  Resume your previous diet.  We are waiting for your pathology results.  Continue your present medications.  Stop marijuana use.

## 2021-09-11 NOTE — Anesthesia Postprocedure Evaluation (Signed)
Anesthesia Post Note  Patient: KAHIL HEIS  Procedure(s) Performed: ESOPHAGOGASTRODUODENOSCOPY (EGD) WITH PROPOFOL BIOPSY  Patient location during evaluation: Phase II Anesthesia Type: General Level of consciousness: awake Pain management: pain level controlled Vital Signs Assessment: post-procedure vital signs reviewed and stable Respiratory status: spontaneous breathing and respiratory function stable Cardiovascular status: blood pressure returned to baseline and stable Postop Assessment: no headache and no apparent nausea or vomiting Anesthetic complications: no Comments: Late entry   No notable events documented.   Last Vitals:  Vitals:   09/11/21 0928 09/11/21 0932  BP: (!) 100/58 (!) 103/55  Pulse: 70 81  Resp: 18 16  Temp: 36.4 C   SpO2: 95% 96%    Last Pain:  Vitals:   09/11/21 0932  TempSrc:   PainSc: 0-No pain                 Louann Sjogren

## 2021-09-11 NOTE — Op Note (Addendum)
[facility] Patient Name: Brendan Wilkinson Procedure Date: 09/11/2021 9:00 AM MRN: [id number] Date of Birth: 08/01/1986 Attending MD: Katrinka Blazing ,  CSN: [id number] Age: 36 Admit Type: Outpatient Procedure:                Upper GI endoscopy Indications:              Epigastric abdominal pain, Nausea Providers:                Katrinka Blazing, Sheran Fava, Dyann Ruddle Referring MD:              Medicines:                Monitored Anesthesia Care Complications:            No immediate complications. Estimated Blood Loss:     Estimated blood loss: none. Procedure:                Pre-Anesthesia Assessment:                           - Prior to the procedure, a History and Physical                            was performed, and patient medications, allergies                            and sensitivities were reviewed. The patient's                            tolerance of previous anesthesia was reviewed.                           - The risks and benefits of the procedure and the                            sedation options and risks were discussed with the                            patient. All questions were answered and informed                            consent was obtained.                           - ASA Grade Assessment: II - A patient with mild                            systemic disease.                           After obtaining informed consent, the endoscope was                            passed under direct vision. Throughout the                            procedure, the patient's blood pressure,  pulse, and                            oxygen saturations were monitored continuously. The                            [id number]-[id number] ([id number] scope was introduced through the                            mouth, and advanced to the second part of duodenum.                            The upper GI endoscopy was accomplished without                            difficulty. The patient  tolerated the procedure                            well. Scope In: 9:16:12 AM Scope Out: 9:23:16 AM Total Procedure Duration: 0 hours 7 minutes 4 seconds  Findings:      A 1 cm hiatal hernia was present.      The entire examined stomach was normal. Biopsies were taken with a cold       forceps for Helicobacter pylori testing.      The examined duodenum was normal. Biopsies were taken with a cold       forceps for histology. Impression:               - 1 cm hiatal hernia.                           - Normal stomach. Biopsied.                           - Normal examined duodenum. Biopsied. Moderate Sedation:      Per Anesthesia Care Recommendation:           - Discharge patient to home (ambulatory).                           - Resume previous diet.                           - Await pathology results.                           - Continue present medications.                           - Stop marijuana use. Procedure Code(s):        --- Professional ---                           [phone], Esophagogastroduodenoscopy, flexible,                            transoral; with biopsy, single or multiple Diagnosis Code(s):        --- Professional ---  K44.9, Diaphragmatic hernia without obstruction or                            gangrene                           R10.13, Epigastric pain                           R11.0, Nausea CPT copyright 2019 American Medical Association. All rights reserved. The codes documented in this report are preliminary and upon coder review may  be revised to meet current compliance requirements. Katrinka Blazing, MD Katrinka Blazing,  09/11/2021 9:27:50 AM This report has been signed electronically. Number of Addenda: 0

## 2021-09-11 NOTE — Transfer of Care (Signed)
Immediate Anesthesia Transfer of Care Note  Patient: Brendan Wilkinson  Procedure(s) Performed: ESOPHAGOGASTRODUODENOSCOPY (EGD) WITH PROPOFOL BIOPSY  Patient Location: PACU  Anesthesia Type:General  Level of Consciousness: awake, alert , oriented and patient cooperative  Airway & Oxygen Therapy: Patient Spontanous Breathing  Post-op Assessment: Report given to RN, Post -op Vital signs reviewed and stable and Patient moving all extremities X 4  Post vital signs: Reviewed and stable  Last Vitals:  Vitals Value Taken Time  BP    Temp    Pulse    Resp    SpO2      Last Pain:  Vitals:   09/11/21 0913  TempSrc:   PainSc: 0-No pain      Patients Stated Pain Goal: 7 (09/11/21 0748)  Complications: No notable events documented.

## 2021-09-11 NOTE — Interval H&P Note (Signed)
History and Physical Interval Note:  09/11/2021 7:43 AM  Brendan Wilkinson  has presented today for surgery, with the diagnosis of Nausea Epigastric Pain.  The various methods of treatment have been discussed with the patient and family. After consideration of risks, benefits and other options for treatment, the patient has consented to  Procedure(s) with comments: ESOPHAGOGASTRODUODENOSCOPY (EGD) WITH PROPOFOL (N/A) - 905 as a surgical intervention.  The patient's history has been reviewed, patient examined, no change in status, stable for surgery.  I have reviewed the patient's chart and labs.  Questions were answered to the patient's satisfaction.     [name]

## 2021-09-15 LAB — SURGICAL PATHOLOGY

## 2021-09-16 ENCOUNTER — Encounter (HOSPITAL_COMMUNITY): Payer: Self-pay | Admitting: Gastroenterology

## 2021-11-03 ENCOUNTER — Other Ambulatory Visit (INDEPENDENT_AMBULATORY_CARE_PROVIDER_SITE_OTHER): Payer: Self-pay | Admitting: Gastroenterology

## 2021-11-03 NOTE — Telephone Encounter (Signed)
Noted  

## 2021-11-30 ENCOUNTER — Ambulatory Visit (INDEPENDENT_AMBULATORY_CARE_PROVIDER_SITE_OTHER): Payer: Medicaid - Out of State | Admitting: Gastroenterology

## 2022-01-07 ENCOUNTER — Other Ambulatory Visit: Payer: Self-pay

## 2022-01-07 ENCOUNTER — Ambulatory Visit
Admission: EM | Admit: 2022-01-07 | Discharge: 2022-01-07 | Disposition: A | Discharge status: Home / Self Care | Payer: Self-pay | Attending: Family Medicine | Admitting: Family Medicine

## 2022-01-07 ENCOUNTER — Encounter: Payer: Self-pay | Admitting: Emergency Medicine

## 2022-01-07 DIAGNOSIS — K047 Periapical abscess without sinus: Secondary | ICD-10-CM

## 2022-01-07 DIAGNOSIS — R220 Localized swelling, mass and lump, head: Secondary | ICD-10-CM

## 2022-01-07 MED ORDER — CLINDAMYCIN HCL 300 MG PO CAPS: 300.0000 mg | ORAL_CAPSULE | Freq: Two times a day (BID) | ORAL | 0 refills | Status: DC

## 2022-01-07 MED ORDER — LIDOCAINE VISCOUS HCL 2 % MT SOLN: 10.0000 mL | OROMUCOSAL | 0 refills | Status: DC | PRN

## 2022-01-07 MED ORDER — CHLORHEXIDINE GLUCONATE 0.12 % MT SOLN: 15.0000 mL | Freq: Two times a day (BID) | OROMUCOSAL | 0 refills | Status: DC

## 2022-01-07 NOTE — ED Triage Notes (Signed)
Pt reports right sided jaw pain and swelling. Pt reports has dental appointment in 3 months but reports pain has increased. Pt reports "I know I need a tooth pulled on that side." ?

## 2022-01-11 NOTE — ED Provider Notes (Signed)
?[facility] ? ? ? ?CSN: [id number] ?Arrival date & time: 01/07/22  0801 ? ? ?  ? ?History   ?Chief Complaint ?Chief Complaint  ?Patient presents with  ? Facial Swelling  ? ? ?HPI ?Brendan Wilkinson is a 36 y.o. male.  ? ?Presenting today with several day history of right-sided dental pain, swelling.  He denies drainage, bleeding, dysphagia, difficulty breathing, fever, chills.  Has a known dental issue in this area and has an appointment in 3 months to get the tooth pulled.  Has been taking over-the-counter pain relievers with minimal relief. ? ? ?Past Medical History:  ?Diagnosis Date  ? GERD (gastroesophageal reflux disease)   ? ? ?Patient Active Problem List  ? Diagnosis Date Noted  ? Abdominal pain, epigastric 09/03/2021  ? Nausea without vomiting 09/03/2021  ? Gastroesophageal reflux disease 09/03/2021  ? Diarrhea 09/03/2021  ? ? ?Past Surgical History:  ?Procedure Laterality Date  ? BIOPSY  09/11/2021  ? Procedure: BIOPSY;  Surgeon: Marguerita Merles, Reuel Boom, MD;  Location: [facility];  Service: Gastroenterology;;  ? ESOPHAGOGASTRODUODENOSCOPY (EGD) WITH PROPOFOL N/A 09/11/2021  ? Procedure: ESOPHAGOGASTRODUODENOSCOPY (EGD) WITH PROPOFOL;  Surgeon: Dolores Frame, MD;  Location: [facility];  Service: Gastroenterology;  Laterality: N/A;  905  ? TONSILLECTOMY    ? ? ? ? ? ?Home Medications   ? ?Prior to Admission medications   ?Medication Sig Start Date End Date Taking? Authorizing Provider  ?chlorhexidine (PERIDEX) 0.12 % solution Use as directed 15 mLs in the mouth or throat 2 (two) times daily. 01/07/22  Yes Particia Nearing, PA-C  ?clindamycin (CLEOCIN) 300 MG capsule Take 1 capsule (300 mg total) by mouth 2 (two) times daily. 01/07/22  Yes Particia Nearing, PA-C  ?lidocaine (XYLOCAINE) 2 % solution Use as directed 10 mLs in the mouth or throat every 3 (three) hours as needed for mouth pain. 01/07/22  Yes Particia Nearing, PA-C  ?pantoprazole (PROTONIX) 40 MG tablet  TAKE 1 TABLET(40 MG) BY MOUTH DAILY 11/03/21   Raquel Clete, NP  ?Polyethyl Glycol-Propyl Glycol (LUBRICANT EYE DROPS) 0.4-0.3 % SOLN Place 1-2 drops into both eyes 3 (three) times daily as needed (dry/irritated eyes.).    [provider]  ?sucralfate (CARAFATE) 1 g tablet Take 1 tablet (1 g total) by mouth 4 (four) times daily -  with meals and at bedtime. 09/03/21   Raquel Ramal, NP  ? ? ?Family History ?History reviewed. No pertinent family history. ? ?Social History ?Social History  ? ?Tobacco Use  ? Smoking status: Former  ? Smokeless tobacco: Former  ?  Types: Chew  ?Vaping Use  ? Vaping Use: Never used  ?Substance Use Topics  ? Alcohol use: No  ? Drug use: Yes  ?  Types: Marijuana  ? ? ? ?Allergies   ?Amoxicillin, Azithromycin, Penicillins, and Sulfa antibiotics ? ? ?Review of Systems ?Review of Systems ?Per HPI ? ?Physical Exam ?Triage Vital Signs ?ED Triage Vitals [01/07/22 0818]  ?Enc Vitals Group  ?   BP 118/77  ?   Pulse Rate 80  ?   Resp 18  ?   Temp 98 ?F (36.7 ?C)  ?   Temp Source Oral  ?   SpO2 98 %  ?   Weight 240 lb (108.9 kg)  ?   Height 6' (1.829 m)  ?   Head Circumference   ?   Peak Flow   ?   Pain Score 10  ?  Pain Loc   ?   Pain Edu?   ?   Excl. in GC?   ? ?No data found. ? ?Updated Vital Signs ?BP 118/77 (BP Location: Right Arm)   Pulse 80   Temp 98 ?F (36.7 ?C) (Oral)   Resp 18   Ht 6' (1.829 m)   Wt 240 lb (108.9 kg)   SpO2 98%   BMI 32.55 kg/m?  ? ?Visual Acuity ?Right Eye Distance:   ?Left Eye Distance:   ?Bilateral Distance:   ? ?Right Eye Near:   ?Left Eye Near:    ?Bilateral Near:    ? ?Physical Exam ?Vitals and nursing note reviewed.  ?Constitutional:   ?   Appearance: Normal appearance.  ?HENT:  ?   Head: Atraumatic.  ?   Mouth/Throat:  ?   Mouth: Mucous membranes are moist.  ?   Comments: Right-sided gingival erythema, edema posterior molars.  Oral airway patent.  No obvious abscess or drainage ?Eyes:  ?   Extraocular Movements: Extraocular movements intact.   ?   Conjunctiva/sclera: Conjunctivae normal.  ?Cardiovascular:  ?   Rate and Rhythm: Normal rate and regular rhythm.  ?Pulmonary:  ?   Effort: Pulmonary effort is normal.  ?   Breath sounds: Normal breath sounds.  ?Musculoskeletal:     ?   General: Normal range of motion.  ?   Cervical back: Normal range of motion and neck supple.  ?Skin: ?   General: Skin is warm and dry.  ?Neurological:  ?   General: No focal deficit present.  ?   Mental Status: He is oriented to person, place, and time.  ?Psychiatric:     ?   Mood and Affect: Mood normal.     ?   Thought Content: Thought content normal.     ?   Judgment: Judgment normal.  ? ? ? ?UC Treatments / Results  ?Labs ?(all labs ordered are listed, but only abnormal results are displayed) ?Labs Reviewed - No data to display ? ?EKG ? ? ?Radiology ?No results found. ? ?Procedures ?Procedures (including critical care time) ? ?Medications Ordered in UC ?Medications - No data to display ? ?Initial Impression / Assessment and Plan / UC Course  ?I have reviewed the triage vital signs and the nursing notes. ? ?Pertinent labs & imaging results that were available during my care of the patient were reviewed by me and considered in my medical decision making (see chart for details). ? ?  ? ?Treat with clindamycin, Peridex, viscous lidocaine, over-the-counter pain relievers.  Follow-up with dentist as soon as possible.  Return for worsening symptoms. ? ?Final Clinical Impressions(s) / UC Diagnoses  ? ?Final diagnoses:  ?Dental infection  ?Facial swelling  ? ?Discharge Instructions   ?None ?  ? ?ED Prescriptions   ? ? Medication Sig Dispense Auth. Provider  ? clindamycin (CLEOCIN) 300 MG capsule Take 1 capsule (300 mg total) by mouth 2 (two) times daily. 14 capsule Particia Nearing, [place]  ? chlorhexidine (PERIDEX) 0.12 % solution Use as directed 15 mLs in the mouth or throat 2 (two) times daily. 120 mL Particia Nearing, [place]  ? lidocaine (XYLOCAINE) 2 % solution Use as  directed 10 mLs in the mouth or throat every 3 (three) hours as needed for mouth pain. 100 mL Particia Nearing, [place]  ? ?  ? ?PDMP not reviewed this encounter. ?  ?Particia Nearing, PA-C ?01/11/22 1004 ? ?

## 2022-02-09 ENCOUNTER — Encounter (HOSPITAL_COMMUNITY): Payer: Self-pay | Admitting: *Deleted

## 2022-02-09 ENCOUNTER — Other Ambulatory Visit: Payer: Self-pay

## 2022-02-09 ENCOUNTER — Emergency Department (HOSPITAL_COMMUNITY)
Admission: EM | Admit: 2022-02-09 | Discharge: 2022-02-09 | Disposition: A | Discharge status: Home / Self Care | Payer: Self-pay | Attending: Emergency Medicine | Admitting: Emergency Medicine

## 2022-02-09 DIAGNOSIS — S61412A Laceration without foreign body of left hand, initial encounter: Secondary | ICD-10-CM | POA: Insufficient documentation

## 2022-02-09 DIAGNOSIS — Z79899 Other long term (current) drug therapy: Secondary | ICD-10-CM | POA: Insufficient documentation

## 2022-02-09 DIAGNOSIS — W278XXA Contact with other nonpowered hand tool, initial encounter: Secondary | ICD-10-CM | POA: Insufficient documentation

## 2022-02-09 DIAGNOSIS — Z23 Encounter for immunization: Secondary | ICD-10-CM | POA: Insufficient documentation

## 2022-02-09 MED ORDER — LIDOCAINE HCL (PF) 1 % IJ SOLN
INTRAMUSCULAR | Status: AC
  Filled 2022-02-09: qty 10

## 2022-02-09 MED ORDER — TETANUS-DIPHTH-ACELL PERTUSSIS 5-2.5-18.5 LF-MCG/0.5 IM SUSY
0.5000 mL | PREFILLED_SYRINGE | Freq: Once | INTRAMUSCULAR | Status: AC
  Administered 2022-02-09: 0.5 mL via INTRAMUSCULAR
  Filled 2022-02-09: qty 0.5

## 2022-02-09 MED ORDER — CEPHALEXIN 500 MG PO CAPS: 500.0000 mg | ORAL_CAPSULE | Freq: Two times a day (BID) | ORAL | 0 refills | Status: AC

## 2022-02-09 NOTE — ED Provider Notes (Signed)
[facility] EMERGENCY DEPARTMENT Provider Note   CSN: [id number] Arrival date & time: 02/09/22  0825     History  Chief Complaint  Patient presents with   Extremity Laceration    Brendan Wilkinson is a 36 y.o. male.  Patient presents with isolated left thumb/palm laceration while chopping wood this morning.  Unsure last tetanus shot.  No blood thinner use.  Bleeding controlled pressure.  Patient denies any weakness or numbness.        Home Medications Prior to Admission medications   Medication Sig Start Date End Date Taking? Authorizing Provider  cephALEXin (KEFLEX) 500 MG capsule Take 1 capsule (500 mg total) by mouth 2 (two) times daily for 5 days. 02/09/22 02/14/22 Yes Blane Ohara, MD  chlorhexidine (PERIDEX) 0.12 % solution Use as directed 15 mLs in the mouth or throat 2 (two) times daily. 01/07/22   Particia Nearing, PA-C  clindamycin (CLEOCIN) 300 MG capsule Take 1 capsule (300 mg total) by mouth 2 (two) times daily. 01/07/22   Particia Nearing, PA-C  lidocaine (XYLOCAINE) 2 % solution Use as directed 10 mLs in the mouth or throat every 3 (three) hours as needed for mouth pain. 01/07/22   Particia Nearing, PA-C  pantoprazole (PROTONIX) 40 MG tablet TAKE 1 TABLET(40 MG) BY MOUTH DAILY 11/03/21   Carlan, Chelsea L, NP  Polyethyl Glycol-Propyl Glycol (LUBRICANT EYE DROPS) 0.4-0.3 % SOLN Place 1-2 drops into both eyes 3 (three) times daily as needed (dry/irritated eyes.).    [provider]  sucralfate (CARAFATE) 1 g tablet Take 1 tablet (1 g total) by mouth 4 (four) times daily -  with meals and at bedtime. 09/03/21   Carlan, Chelsea L, NP      Allergies    Amoxicillin, Azithromycin, Penicillins, and Sulfa antibiotics    Review of Systems   Review of Systems  Constitutional:  Negative for chills and fever.  HENT:  Negative for congestion.   Eyes:  Negative for visual disturbance.  Respiratory:  Negative for shortness of breath.   Cardiovascular:   Negative for chest pain.  Gastrointestinal:  Negative for abdominal pain and vomiting.  Genitourinary:  Negative for dysuria and flank pain.  Musculoskeletal:  Negative for back pain, neck pain and neck stiffness.  Skin:  Positive for wound. Negative for rash.  Neurological:  Negative for light-headedness and headaches.    Physical Exam Updated Vital Signs BP 135/77 (BP Location: Right Arm)   Pulse 80   Temp 97.8 F (36.6 C) (Oral)   Resp 18   Ht 6' (1.829 m)   Wt 104.3 kg   SpO2 99%   BMI 31.19 kg/m  Physical Exam Vitals and nursing note reviewed.  Constitutional:      General: He is not in acute distress.    Appearance: He is well-developed.  HENT:     Head: Normocephalic.     Mouth/Throat:     Mouth: Mucous membranes are moist.  Eyes:     General:        Right eye: No discharge.        Left eye: No discharge.     Conjunctiva/sclera: Conjunctivae normal.  Neck:     Trachea: No tracheal deviation.  Cardiovascular:     Rate and Rhythm: Normal rate.  Pulmonary:     Effort: Pulmonary effort is normal.  Abdominal:     General: There is no distension.     Palpations: Abdomen is soft.  Musculoskeletal:  Cervical back: Normal range of motion.  Skin:    General: Skin is warm.     Capillary Refill: Capillary refill takes less than 2 seconds.     Comments: Patient has 3 cm laceration with mild gaping across proximal palmar aspect of left thumb.  Patient has normal strength with opposition, flexion extension.  Sensation intact distal to laceration.  Bleeding controlled pressure.  Patient has normal strength of tendons with flexion.  Neurological:     General: No focal deficit present.     Mental Status: He is alert.  Psychiatric:        Mood and Affect: Mood normal.     ED Results / Procedures / Treatments   Labs (all labs ordered are listed, but only abnormal results are displayed) Labs Reviewed - No data to display  EKG None  Radiology No results  found.  Procedures .Marland Kitchen.Laceration Repair  Date/Time: 02/09/2022 9:10 AM  Performed by: Blane OharaZavitz, Patrici Minnis, MD Authorized by: Blane OharaZavitz, Zackari Ruane, MD   Consent:    Consent obtained:  Verbal   Consent given by:  Patient   Risks, benefits, and alternatives were discussed: yes     Risks discussed:  Infection, pain, poor cosmetic result, need for additional repair, nerve damage and poor wound healing Universal protocol:    Procedure explained and questions answered to patient or proxy's satisfaction: yes     Patient identity confirmed:  Verbally with patient Anesthesia:    Anesthesia method:  None Laceration details:    Location:  Hand   Hand location:  L palm   Length (cm):  3   Depth (mm):  5 Pre-procedure details:    Preparation:  Patient was prepped and draped in usual sterile fashion Exploration:    Hemostasis achieved with:  Direct pressure Treatment:    Area cleansed with:  Chlorhexidine   Amount of cleaning:  Standard   Irrigation solution:  Sterile saline   Irrigation volume:  100   Irrigation method:  Syringe   Visualized foreign bodies/material removed: no     Debridement:  Minimal   Undermining:  Minimal   Scar revision: no   Skin repair:    Repair method:  Sutures   Suture size:  4-0   Suture material:  Nylon   Suture technique:  Simple interrupted   Number of sutures:  7 Approximation:    Approximation:  Close Repair type:    Repair type:  Intermediate Post-procedure details:    Dressing:  Non-adherent dressing   Procedure completion:  Tolerated Comments:     Lidocaine used     Medications Ordered in ED Medications  Tdap (BOOSTRIX) injection 0.5 mL (0.5 mLs Intramuscular Given 02/09/22 0849)  lidocaine (PF) (XYLOCAINE) 1 % injection (  Given by Other 02/09/22 0850)    ED Course/ Medical Decision Making/ A&P                           Medical Decision Making Risk Prescription drug management.   Patient presents with isolated left palm laceration  fortunately no signs of significant tendon or arterial injury.  Discussed risk and benefits of repair and reasons to return.  Absorbable sutures utilized.  Thumb spica splint to help improve repair and Keflex antibiotics for which she does not have an allergy to reviewed with patient.         Final Clinical Impression(s) / ED Diagnoses Final diagnoses:  Laceration of left palm, initial encounter  Rx / DC Orders ED Discharge Orders          Ordered    cephALEXin (KEFLEX) 500 MG capsule  2 times daily        02/09/22 7026              Blane Ohara, MD 02/09/22 [phone]

## 2022-02-09 NOTE — Discharge Instructions (Signed)
Keep wound clean with gentle soap and water.  No bathtubs, hot tubs or swimming pools until closed.  Wear Velcro splint often to decrease movement of the thumb.  Work note provided.  Antibiotics for 5 days.  See a clinician for signs of infection.

## 2022-02-09 NOTE — ED Triage Notes (Signed)
Pt c/o laceration to bottom of left thumb while chopping wood with a dull hatchet this morning. Denies use of blood thinners. Bleeding controlled.

## 2022-03-11 IMAGING — CT CT ABD-PELV W/ CM
2 of 4 series · 16 of 46 positions shown, 18 images · IV contrast (Omnipaque or Isovue)
Comparison: 11/21/2011

CLINICAL DATA: Abdominal pain, fever, vomiting

EXAM:
CT ABDOMEN AND PELVIS WITH CONTRAST
TECHNIQUE: Multidetector CT imaging of the abdomen and pelvis was performed
using the standard protocol following bolus administration of
intravenous contrast.
CONTRAST:  100mL OMNIPAQUE IOHEXOL 300 MG/ML  SOLN

[Series 2: axial st · axial · 0.97mm/px · z∈[-1039,-514]mm · 13 of 115 slices shown, 15 images]
[im 5/115  soft-tissue]
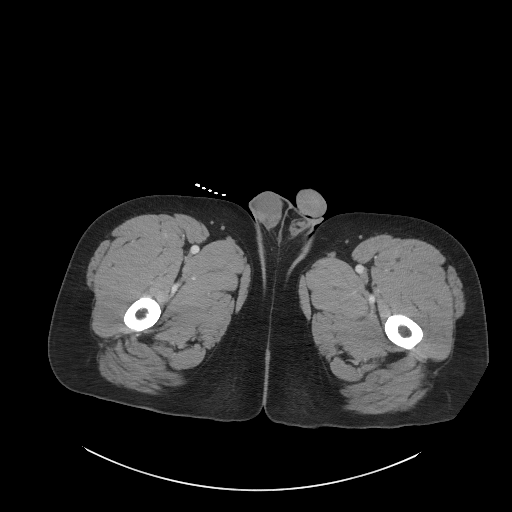
[im 5/115  bone]
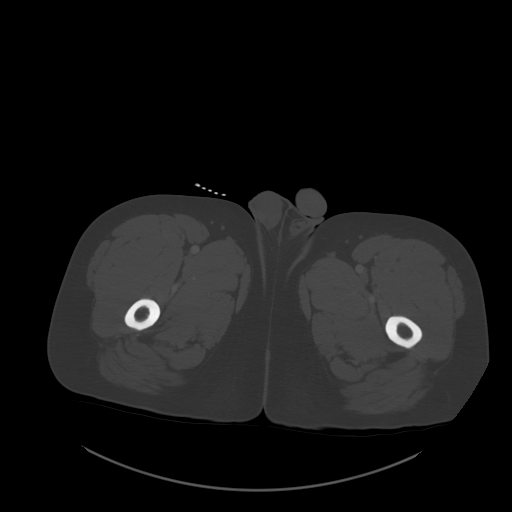
[im 15/115  soft-tissue]
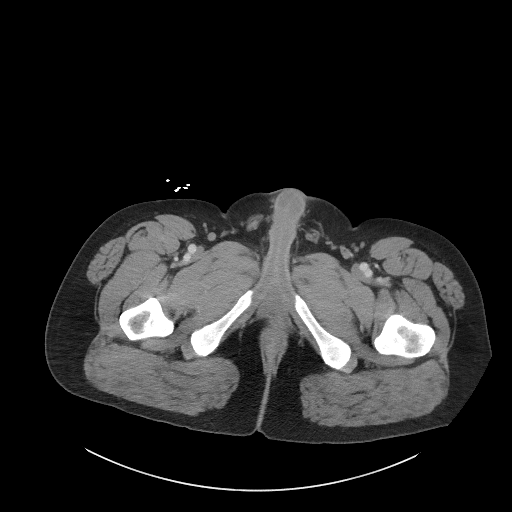
[im 25/115  soft-tissue]
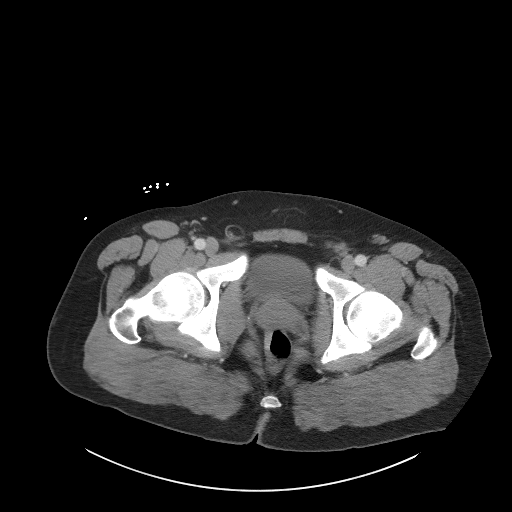
[im 30/115  soft-tissue]
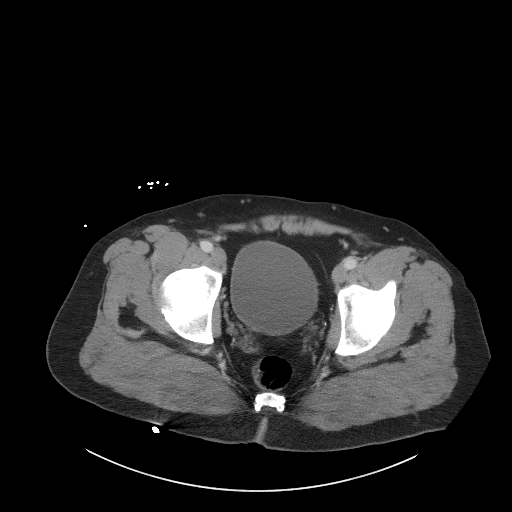
[im 40/115  soft-tissue]
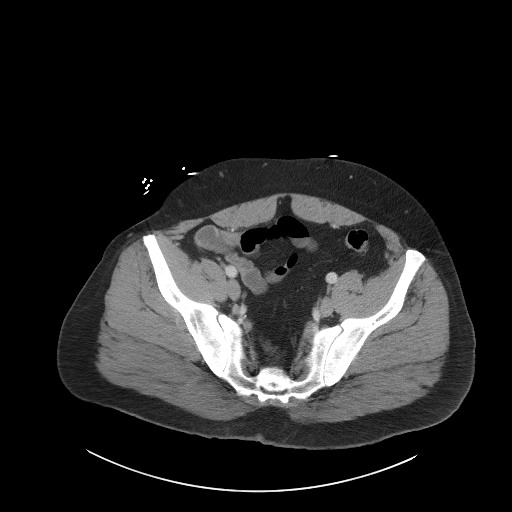
[im 50/115  soft-tissue]
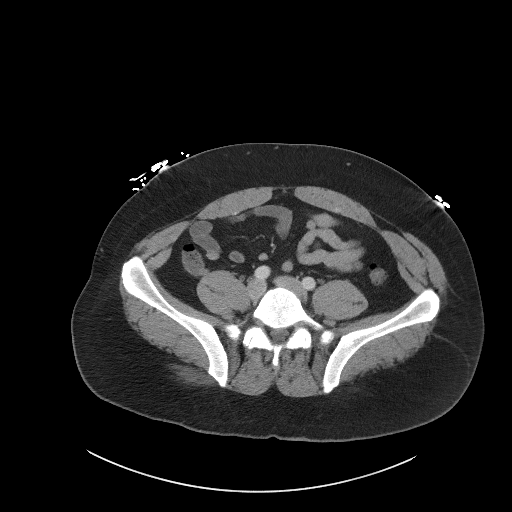
[im 60/115  soft-tissue]
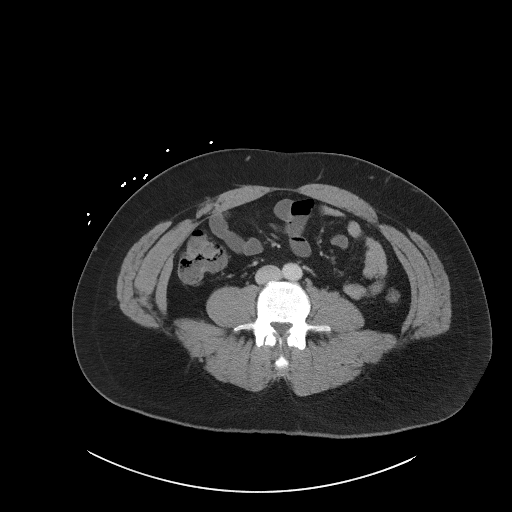
[im 65/115  soft-tissue]
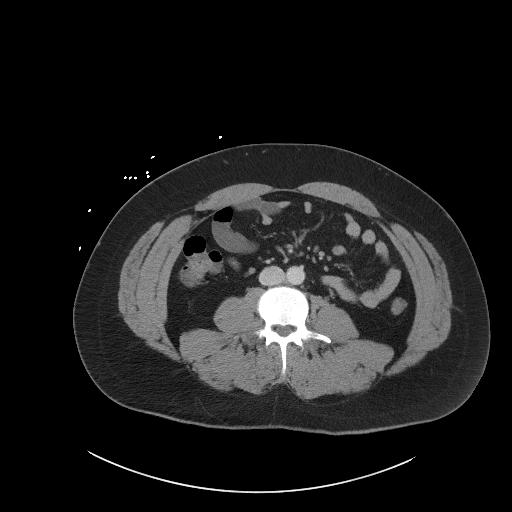
[im 75/115  soft-tissue]
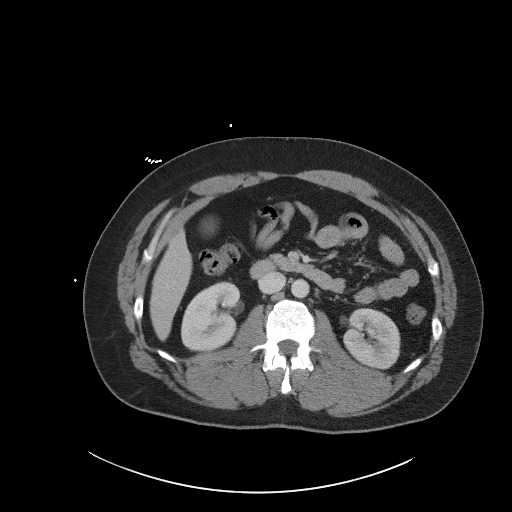
[im 75/115  bone]
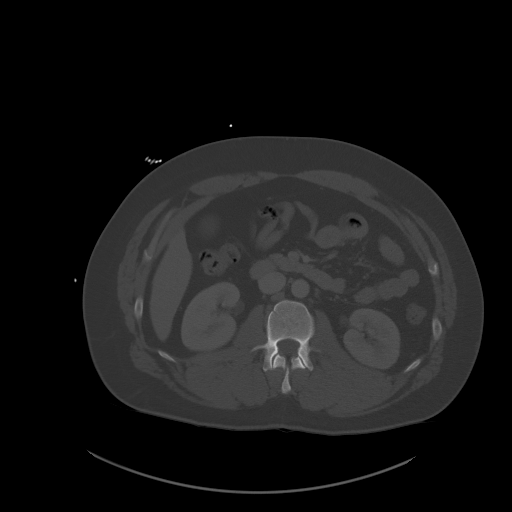
[im 85/115  soft-tissue]
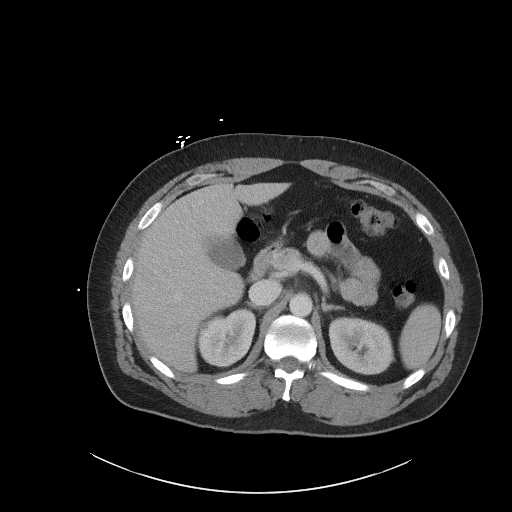
[im 90/115  soft-tissue]
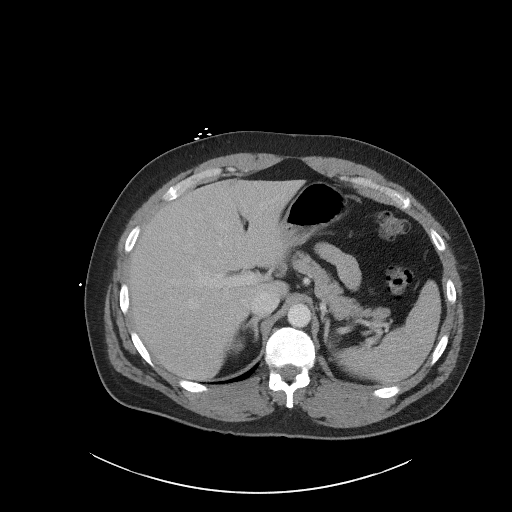
[im 100/115  soft-tissue]
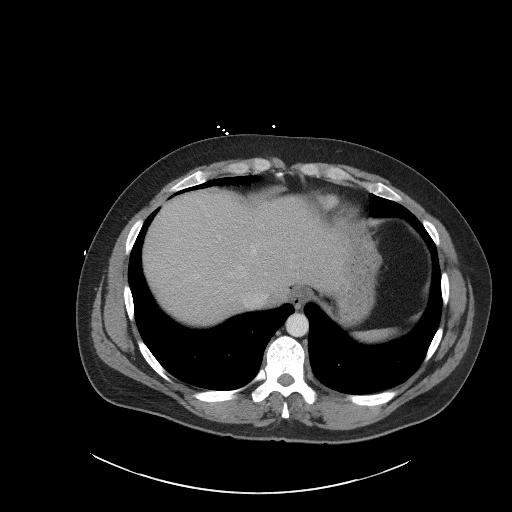
[im 110/115  soft-tissue]
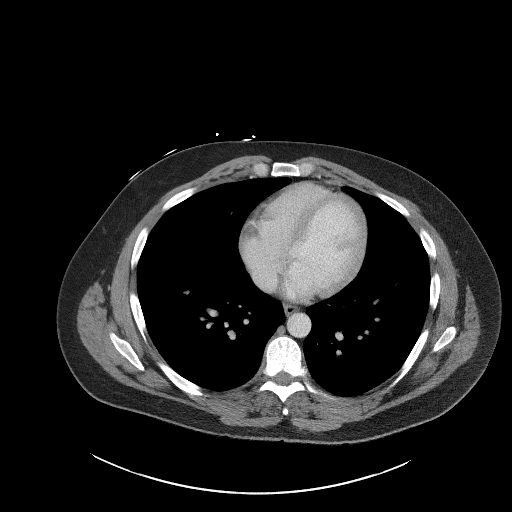

[Series 5: coronal st · coronal · 0.91mm/px · 3 of 94 slices shown]
[im 32/94  soft-tissue]
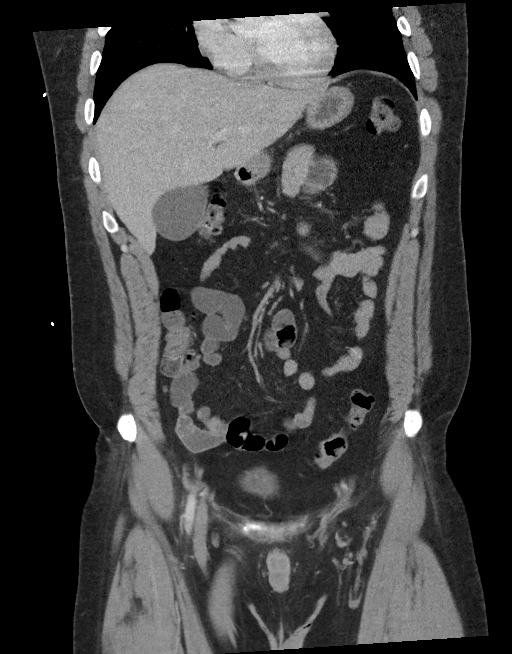
[im 42/94  soft-tissue]
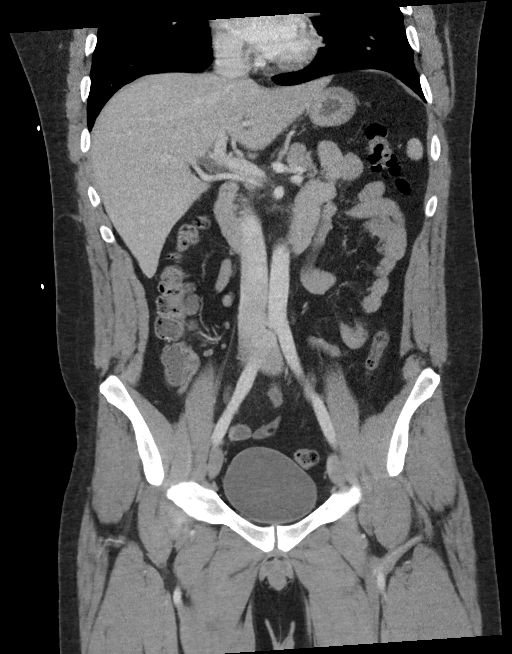
[im 52/94  soft-tissue]
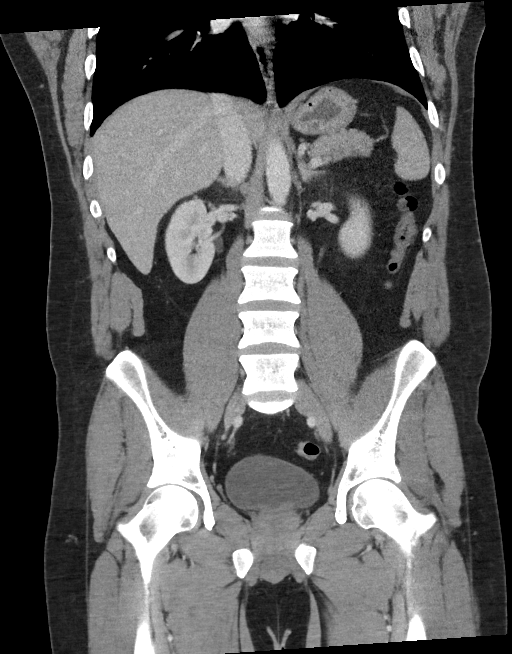

[16 of 46 positions shown; findings below may reference images not displayed]

FINDINGS: Lower chest: Lung bases are clear.

Hepatobiliary: Liver is within normal limits.

Gallbladder is unremarkable. No intrahepatic or extrahepatic ductal
dilatation.

Pancreas: Within normal limits.

Spleen: Within normal limits.

Adrenals/Urinary Tract: Adrenal glands are within normal limits.

Kidneys are within normal limits.  No hydronephrosis.

Bladder is within normal limits.

Stomach/Bowel: Stomach is within normal limits.

No evidence of bowel obstruction.

Normal appendix (series 2/image 69).

No colonic wall thickening or inflammatory changes.

Vascular/Lymphatic: Aneurysm

No suspicious abdominopelvic lymphadenopathy.

Reproductive: Prostate is unremarkable.

Other: No abdominopelvic ascites.

Minimal fat in the right inguinal canal.

Musculoskeletal: Visualized osseous structures are within normal
limits.
IMPRESSION: Negative CT abdomen/pelvis.

## 2022-07-14 ENCOUNTER — Ambulatory Visit
Admission: EM | Admit: 2022-07-14 | Discharge: 2022-07-14 | Disposition: A | Discharge status: Home / Self Care | Payer: Medicaid Other | Attending: Nurse Practitioner | Admitting: Nurse Practitioner

## 2022-07-14 DIAGNOSIS — J069 Acute upper respiratory infection, unspecified: Secondary | ICD-10-CM

## 2022-07-14 DIAGNOSIS — Z8709 Personal history of other diseases of the respiratory system: Secondary | ICD-10-CM

## 2022-07-14 LAB — POCT RAPID STREP A (OFFICE): Rapid Strep A Screen: NEGATIVE

## 2022-07-14 MED ORDER — PSEUDOEPH-BROMPHEN-DM 30-2-10 MG/5ML PO SYRP: 5.0000 mL | ORAL_SOLUTION | Freq: Four times a day (QID) | ORAL | 0 refills | Status: DC | PRN

## 2022-07-14 MED ORDER — FLUTICASONE PROPIONATE 50 MCG/ACT NA SUSP: 2.0000 | Freq: Every day | NASAL | 0 refills | Status: DC

## 2022-07-14 NOTE — ED Triage Notes (Signed)
Pt states he hasn't been feeling well for the past 3 weeks. States today he woke up with sore throat,cough and chest pain. States he took a Covid test last week and it was negative.

## 2022-07-14 NOTE — Discharge Instructions (Signed)
The rapid strep test is negative.  A throat culture is pending.  You will be contacted if the results of the culture are positive. Take medication as prescribed.  Continue your current allergy medicine at this time. Increase fluids and allow for plenty of rest. Recommend Tylenol or ibuprofen as needed for pain, fever, or general discomfort. Warm salt water gargles 3-4 times daily to help with throat pain or discomfort. Recommend using a humidifier at bedtime during sleep to help with cough and nasal congestion. Sleep elevated on 2 pillows cough symptoms persist. If you continue to experience symptoms over the next 10 to 14 days, please follow-up in this clinic for further evaluation.  Please follow-up sooner if you develop new symptoms such as fever, wheezing, shortness of breath, or difficulty breathing. Follow-up as needed.

## 2022-07-14 NOTE — ED Provider Notes (Signed)
[facility]    CSN: [id number] Arrival date & time: 07/14/22  1656      History   Chief Complaint Chief Complaint  Patient presents with   Sore Throat    HPI Brendan Wilkinson is a 36 y.o. male.   The history is provided by the patient.   Patient presents for complaints of cough, sore throat, postnasal drainage, and nasal congestion.  So endorses chest pain with the cough.  Patient denies fever, chills, headache, ear pain, wheezing, shortness of breath, difficulty breathing, or GI symptoms.  Patient states over the past 3 weeks, he has not felt his best, but the symptoms that he is currently experiencing started 1 day ago.  Patient endorses a history of seasonal allergies, reports that he takes Xyzal daily.  Patient states that he did take a COVID test last week which was negative.  He denies any further sick contacts at this time.  Patient declines COVID testing today. Past Medical History:  Diagnosis Date   GERD (gastroesophageal reflux disease)     Patient Active Problem List   Diagnosis Date Noted   Abdominal pain, epigastric 09/03/2021   Nausea without vomiting 09/03/2021   Gastroesophageal reflux disease 09/03/2021   Diarrhea 09/03/2021    Past Surgical History:  Procedure Laterality Date   BIOPSY  09/11/2021   Procedure: BIOPSY;  Surgeon: Dolores Frame, MD;  Location: [facility];  Service: Gastroenterology;;   ESOPHAGOGASTRODUODENOSCOPY (EGD) WITH PROPOFOL N/A 09/11/2021   Procedure: ESOPHAGOGASTRODUODENOSCOPY (EGD) WITH PROPOFOL;  Surgeon: Dolores Frame, MD;  Location: [facility];  Service: Gastroenterology;  Laterality: N/A;  905   TONSILLECTOMY         Home Medications    Prior to Admission medications   Medication Sig Start Date End Date Taking? Authorizing Provider  brompheniramine-pseudoephedrine-DM 30-2-10 MG/5ML syrup Take 5 mLs by mouth 4 (four) times daily as needed. 07/14/22  Yes Malie Kashani-Warren, Sadie Haber, NP   fluticasone (FLONASE) 50 MCG/ACT nasal spray Place 2 sprays into both nostrils daily. 07/14/22  Yes Abir Eroh-Warren, Sadie Haber, NP  chlorhexidine (PERIDEX) 0.12 % solution Use as directed 15 mLs in the mouth or throat 2 (two) times daily. 01/07/22   Particia Nearing, PA-C  clindamycin (CLEOCIN) 300 MG capsule Take 1 capsule (300 mg total) by mouth 2 (two) times daily. 01/07/22   Particia Nearing, PA-C  lidocaine (XYLOCAINE) 2 % solution Use as directed 10 mLs in the mouth or throat every 3 (three) hours as needed for mouth pain. 01/07/22   Particia Nearing, PA-C  pantoprazole (PROTONIX) 40 MG tablet TAKE 1 TABLET(40 MG) BY MOUTH DAILY 11/03/21   Carlan, Chelsea L, NP  Polyethyl Glycol-Propyl Glycol (LUBRICANT EYE DROPS) 0.4-0.3 % SOLN Place 1-2 drops into both eyes 3 (three) times daily as needed (dry/irritated eyes.).    [provider]  sucralfate (CARAFATE) 1 g tablet Take 1 tablet (1 g total) by mouth 4 (four) times daily -  with meals and at bedtime. 09/03/21   Raquel Mahmud, NP    Family History History reviewed. No pertinent family history.  Social History Social History   Tobacco Use   Smoking status: Former   Smokeless tobacco: Former    Types: Associate Professor Use: Never used  Substance Use Topics   Alcohol use: Yes    Comment: occasionally   Drug use: Not Currently    Types: Marijuana    Comment: stopped marijuana use 1  month ago as of 02/09/22     Allergies   Amoxicillin, Azithromycin, Penicillins, and Sulfa antibiotics   Review of Systems Review of Systems Per HPI  Physical Exam Triage Vital Signs ED Triage Vitals  Enc Vitals Group     BP 07/14/22 1733 118/78     Pulse Rate 07/14/22 1733 78     Resp 07/14/22 1733 16     Temp 07/14/22 1733 98.2 F (36.8 C)     Temp Source 07/14/22 1733 Oral     SpO2 07/14/22 1733 97 %     Weight --      Height --      Head Circumference --      Peak Flow --      Pain Score 07/14/22 1734  4     Pain Loc --      Pain Edu? --      Excl. in [place]? --    No data found.  Updated Vital Signs BP 118/78 (BP Location: Right Arm)   Pulse 78   Temp 98.2 F (36.8 C) (Oral)   Resp 16   SpO2 97%   Visual Acuity Right Eye Distance:   Left Eye Distance:   Bilateral Distance:    Right Eye Near:   Left Eye Near:    Bilateral Near:     Physical Exam Vitals and nursing note reviewed.  Constitutional:      General: He is not in acute distress.    Appearance: He is well-developed.  HENT:     Head: Normocephalic.     Right Ear: Tympanic membrane and ear canal normal.     Left Ear: Tympanic membrane and ear canal normal.     Nose: Congestion present.     Mouth/Throat:     Mouth: Mucous membranes are moist.     Pharynx: Pharyngeal swelling, posterior oropharyngeal erythema and uvula swelling present.     Tonsils: No tonsillar exudate.  Eyes:     Conjunctiva/sclera: Conjunctivae normal.     Pupils: Pupils are equal, round, and reactive to light.  Cardiovascular:     Rate and Rhythm: Normal rate and regular rhythm.     Heart sounds: Normal heart sounds.  Pulmonary:     Effort: Pulmonary effort is normal. No respiratory distress.     Breath sounds: Normal breath sounds. No stridor. No wheezing, rhonchi or rales.  Abdominal:     General: Bowel sounds are normal.     Palpations: Abdomen is soft.     Tenderness: There is no abdominal tenderness.  Musculoskeletal:     Cervical back: Normal range of motion.  Lymphadenopathy:     Cervical: No cervical adenopathy.  Skin:    General: Skin is warm and dry.  Neurological:     General: No focal deficit present.     Mental Status: He is alert and oriented to person, place, and time.  Psychiatric:        Mood and Affect: Mood normal.        Behavior: Behavior normal.      UC Treatments / Results  Labs (all labs ordered are listed, but only abnormal results are displayed) Labs Reviewed  POCT RAPID STREP A (OFFICE)     EKG   Radiology No results found.  Procedures Procedures (including critical care time)  Medications Ordered in UC Medications - No data to display  Initial Impression / Assessment and Plan / UC Course  I have reviewed the triage vital signs and  the nursing notes.  Pertinent labs & imaging results that were available during my care of the patient were reviewed by me and considered in my medical decision making (see chart for details).   Viral upper respiratory tract infection with cough 2.   History of allergic rhinitis  Suspect symptoms are of viral etiology.  Patient's vital signs are stable, he is well-appearing, and he is in no acute distress.  Underlying history of allergic rhinitis most likely contributing to his symptoms.  We will treat patient with fluticasone nasal spray 50 mcg, and Bromfed cough syrup.  Patient was advised to continue the Xyzal he is currently taking.  Supportive care recommendations were provided to the patient.  Patient declined further COVID testing at this time.  Patient verbalizes understanding.  All questions were answered.  Patient is stable for discharge.  Work note was provided. Final Clinical Impressions(s) / UC Diagnoses   Final diagnoses:  Viral upper respiratory tract infection with cough  History of allergic rhinitis     Discharge Instructions      The rapid strep test is negative.  A throat culture is pending.  You will be contacted if the results of the culture are positive. Take medication as prescribed.  Continue your current allergy medicine at this time. Increase fluids and allow for plenty of rest. Recommend Tylenol or ibuprofen as needed for pain, fever, or general discomfort. Warm salt water gargles 3-4 times daily to help with throat pain or discomfort. Recommend using a humidifier at bedtime during sleep to help with cough and nasal congestion. Sleep elevated on 2 pillows cough symptoms persist. If you continue to  experience symptoms over the next 10 to 14 days, please follow-up in this clinic for further evaluation.  Please follow-up sooner if you develop new symptoms such as fever, wheezing, shortness of breath, or difficulty breathing. Follow-up as needed.      ED Prescriptions     Medication Sig Dispense Auth. Provider   fluticasone (FLONASE) 50 MCG/ACT nasal spray Place 2 sprays into both nostrils daily. 16 g Kaytlin Burklow-Warren, Alda Lea, NP   brompheniramine-pseudoephedrine-DM 30-2-10 MG/5ML syrup Take 5 mLs by mouth 4 (four) times daily as needed. 140 mL Sunaina Ferrando-Warren, Alda Lea, NP      PDMP not reviewed this encounter.   Tish Men, NP 07/14/22 1824

## 2022-07-17 LAB — CULTURE, GROUP A STREP (THRC)

## 2022-09-29 ENCOUNTER — Other Ambulatory Visit: Payer: Self-pay

## 2022-09-29 ENCOUNTER — Encounter: Payer: Self-pay | Admitting: Emergency Medicine

## 2022-09-29 ENCOUNTER — Ambulatory Visit
Admission: EM | Admit: 2022-09-29 | Discharge: 2022-09-29 | Disposition: A | Discharge status: Home / Self Care | Payer: Self-pay

## 2022-09-29 DIAGNOSIS — J111 Influenza due to unidentified influenza virus with other respiratory manifestations: Secondary | ICD-10-CM

## 2022-09-29 NOTE — ED Triage Notes (Signed)
Pt reports chills, body aches, headache since Monday. Pt reports feels better but reports needs note for work. Pt reports spouse and child have been treated for flu.

## 2022-10-03 NOTE — ED Provider Notes (Signed)
[facility]    CSN: [id number] Arrival date & time: 09/29/22  0803      History   Chief Complaint Chief Complaint  Patient presents with   Chills    HPI Brendan Wilkinson is a 37 y.o. male.   Presenting today with several day history of fever, chills, body aches, headaches, fatigue. Denies significant cough, congestion, CP, SOB, abdominal pain, N/V/D. So far not trying anything OTC for sxs. States wife and child currently have influenza. Needing a note for work.    Past Medical History:  Diagnosis Date   GERD (gastroesophageal reflux disease)     Patient Active Problem List   Diagnosis Date Noted   Abdominal pain, epigastric 09/03/2021   Nausea without vomiting 09/03/2021   Gastroesophageal reflux disease 09/03/2021   Diarrhea 09/03/2021    Past Surgical History:  Procedure Laterality Date   BIOPSY  09/11/2021   Procedure: BIOPSY;  Surgeon: Harvel Quale, MD;  Location: [facility];  Service: Gastroenterology;;   ESOPHAGOGASTRODUODENOSCOPY (EGD) WITH PROPOFOL N/A 09/11/2021   Procedure: ESOPHAGOGASTRODUODENOSCOPY (EGD) WITH PROPOFOL;  Surgeon: Harvel Quale, MD;  Location: [facility];  Service: Gastroenterology;  Laterality: N/A;  [place] Medications    Prior to Admission medications   Medication Sig Start Date End Date Taking? Authorizing Provider  brompheniramine-pseudoephedrine-DM 30-2-10 MG/5ML syrup Take 5 mLs by mouth 4 (four) times daily as needed. 07/14/22   Leath-Warren, Alda Lea, NP  chlorhexidine (PERIDEX) 0.12 % solution Use as directed 15 mLs in the mouth or throat 2 (two) times daily. 01/07/22   Volney American, PA-C  clindamycin (CLEOCIN) 300 MG capsule Take 1 capsule (300 mg total) by mouth 2 (two) times daily. 01/07/22   Volney American, PA-C  fluticasone [facility]) 50 MCG/ACT nasal spray Place 2 sprays into both nostrils daily. 07/14/22   Leath-Warren, Alda Lea, NP   lidocaine (XYLOCAINE) 2 % solution Use as directed 10 mLs in the mouth or throat every 3 (three) hours as needed for mouth pain. 01/07/22   Volney American, PA-C  pantoprazole (PROTONIX) 40 MG tablet TAKE 1 TABLET(40 MG) BY MOUTH DAILY 11/03/21   Carlan, Chelsea L, NP  Polyethyl Glycol-Propyl Glycol (LUBRICANT EYE DROPS) 0.4-0.3 % SOLN Place 1-2 drops into both eyes 3 (three) times daily as needed (dry/irritated eyes.).    [provider]  sucralfate (CARAFATE) 1 g tablet Take 1 tablet (1 g total) by mouth 4 (four) times daily -  with meals and at bedtime. 09/03/21   Gabriel Rung, NP    Family History History reviewed. No pertinent family history.  Social History Social History   Tobacco Use   Smoking status: Former   Smokeless tobacco: Former    Types: Nurse, children's Use: Never used  Substance Use Topics   Alcohol use: Yes    Comment: occasionally   Drug use: Not Currently    Types: Marijuana     Allergies   Amoxicillin, Azithromycin, Penicillins, and Sulfa antibiotics   Review of Systems Review of Systems PER HPI  Physical Exam Triage Vital Signs ED Triage Vitals  Enc Vitals Group     BP 09/29/22 0822 116/74     Pulse Rate 09/29/22 0822 71     Resp 09/29/22 0822 16     Temp 09/29/22 0822 98.1 F (36.7 C)     Temp Source 09/29/22 3474 Oral  SpO2 09/29/22 0822 98 %     Weight --      Height --      Head Circumference --      Peak Flow --      Pain Score 09/29/22 0829 3     Pain Loc --      Pain Edu? --      Excl. in [place]? --    No data found.  Updated Vital Signs BP 116/74 (BP Location: Right Arm)   Pulse 71   Temp 98.1 F (36.7 C) (Oral)   Resp 16   SpO2 98%   Visual Acuity Right Eye Distance:   Left Eye Distance:   Bilateral Distance:    Right Eye Near:   Left Eye Near:    Bilateral Near:     Physical Exam Vitals and nursing note reviewed.  Constitutional:      Appearance: Normal appearance.  HENT:     Head:  Atraumatic.     Nose: Nose normal.     Mouth/Throat:     Mouth: Mucous membranes are moist.  Eyes:     Extraocular Movements: Extraocular movements intact.     Conjunctiva/sclera: Conjunctivae normal.  Cardiovascular:     Rate and Rhythm: Normal rate and regular rhythm.  Pulmonary:     Effort: Pulmonary effort is normal.     Breath sounds: Normal breath sounds.  Musculoskeletal:        General: Normal range of motion.     Cervical back: Normal range of motion and neck supple.  Skin:    General: Skin is warm and dry.  Neurological:     General: No focal deficit present.     Mental Status: He is oriented to person, place, and time.  Psychiatric:        Mood and Affect: Mood normal.        Thought Content: Thought content normal.        Judgment: Judgment normal.      UC Treatments / Results  Labs (all labs ordered are listed, but only abnormal results are displayed) Labs Reviewed - No data to display  EKG   Radiology No results found.  Procedures Procedures (including critical care time)  Medications Ordered in UC Medications - No data to display  Initial Impression / Assessment and Plan / UC Course  I have reviewed the triage vital signs and the nursing notes.  Pertinent labs & imaging results that were available during my care of the patient were reviewed by me and considered in my medical decision making (see chart for details).     Overall well appearing with normal vital signs. Suspect influenza based on sxs and exposures. He declines tamiflu and wishes to take OTC remedies. Work note given, return for worsening sxs.  Final Clinical Impressions(s) / UC Diagnoses   Final diagnoses:  Influenza-like illness   Discharge Instructions   None    ED Prescriptions   None    PDMP not reviewed this encounter.   Volney American, [place] 10/03/22 2125

## 2023-03-03 ENCOUNTER — Encounter (HOSPITAL_COMMUNITY): Payer: Self-pay

## 2023-03-03 ENCOUNTER — Other Ambulatory Visit: Payer: Self-pay

## 2023-03-03 ENCOUNTER — Emergency Department (HOSPITAL_COMMUNITY)
Admission: EM | Admit: 2023-03-03 | Discharge: 2023-03-03 | Disposition: A | Discharge status: Home / Self Care | Payer: BLUE CROSS/BLUE SHIELD | Attending: Emergency Medicine | Admitting: Emergency Medicine

## 2023-03-03 DIAGNOSIS — R1010 Upper abdominal pain, unspecified: Secondary | ICD-10-CM | POA: Diagnosis not present

## 2023-03-03 DIAGNOSIS — F10120 Alcohol abuse with intoxication, uncomplicated: Secondary | ICD-10-CM

## 2023-03-03 DIAGNOSIS — R112 Nausea with vomiting, unspecified: Secondary | ICD-10-CM | POA: Diagnosis present

## 2023-03-03 DIAGNOSIS — R197 Diarrhea, unspecified: Secondary | ICD-10-CM | POA: Diagnosis not present

## 2023-03-03 DIAGNOSIS — F10920 Alcohol use, unspecified with intoxication, uncomplicated: Secondary | ICD-10-CM | POA: Insufficient documentation

## 2023-03-03 LAB — COMPREHENSIVE METABOLIC PANEL
ALT: 35 U/L (ref 0–44)
AST: 42 U/L — ABNORMAL HIGH (ref 15–41)
Albumin: 5.2 g/dL — ABNORMAL HIGH (ref 3.5–5.0)
Alkaline Phosphatase: 55 U/L (ref 38–126)
Anion gap: 17 — ABNORMAL HIGH (ref 5–15)
BUN: 17 mg/dL (ref 6–20)
CO2: 19 mmol/L — ABNORMAL LOW (ref 22–32)
Calcium: 9.7 mg/dL (ref 8.9–10.3)
Chloride: 100 mmol/L (ref 98–111)
Creatinine, Ser: 0.97 mg/dL (ref 0.61–1.24)
GFR, Estimated: >60 mL/min (ref >60)
Glucose, Bld: 114 mg/dL — ABNORMAL HIGH (ref 70–99)
Potassium: 3.6 mmol/L (ref 3.5–5.1)
Sodium: 136 mmol/L (ref 135–145)
Total Bilirubin: 1.1 mg/dL (ref 0.3–1.2)
Total Protein: 9 g/dL — ABNORMAL HIGH (ref 6.5–8.1)

## 2023-03-03 LAB — CBC
HCT: 45.5 % (ref 39.0–52.0)
Hemoglobin: 16.6 g/dL (ref 13.0–17.0)
MCH: 31.6 pg (ref 26.0–34.0)
MCHC: 36.5 g/dL — ABNORMAL HIGH (ref 30.0–36.0)
MCV: 86.5 fL (ref 80.0–100.0)
Platelets: 370 10*3/uL (ref 150–400)
RBC: 5.26 MIL/uL (ref 4.22–5.81)
RDW: 12.7 % (ref 11.5–15.5)
WBC: 14.1 10*3/uL — ABNORMAL HIGH (ref 4.0–10.5)
nRBC: 0 % (ref 0.0–0.2)

## 2023-03-03 LAB — LIPASE, BLOOD: Lipase: 27 U/L (ref 11–51)

## 2023-03-03 LAB — ETHANOL: Alcohol, Ethyl (B): <10 mg/dL (ref <10)

## 2023-03-03 MED ORDER — PROCHLORPERAZINE MALEATE 10 MG PO TABS: 10.0000 mg | ORAL_TABLET | Freq: Two times a day (BID) | ORAL | 0 refills | Status: DC | PRN

## 2023-03-03 MED ORDER — SODIUM CHLORIDE 0.9 % IV BOLUS
1000.0000 mL | Freq: Once | INTRAVENOUS | Status: AC
  Administered 2023-03-03: 1000 mL via INTRAVENOUS

## 2023-03-03 MED ORDER — DIPHENHYDRAMINE HCL 50 MG/ML IJ SOLN
12.5000 mg | Freq: Once | INTRAMUSCULAR | Status: AC
  Administered 2023-03-03: 12.5 mg via INTRAVENOUS
  Filled 2023-03-03: qty 1

## 2023-03-03 MED ORDER — LACTATED RINGERS IV BOLUS
1000.0000 mL | Freq: Once | INTRAVENOUS | Status: AC
  Administered 2023-03-03: 1000 mL via INTRAVENOUS

## 2023-03-03 MED ORDER — PROCHLORPERAZINE EDISYLATE 10 MG/2ML IJ SOLN
10.0000 mg | Freq: Once | INTRAMUSCULAR | Status: AC
  Administered 2023-03-03: 10 mg via INTRAVENOUS
  Filled 2023-03-03: qty 2

## 2023-03-03 MED ORDER — ONDANSETRON HCL 4 MG/2ML IJ SOLN
4.0000 mg | Freq: Once | INTRAMUSCULAR | Status: AC
  Administered 2023-03-03: 4 mg via INTRAVENOUS
  Filled 2023-03-03: qty 2

## 2023-03-03 NOTE — ED Notes (Signed)
Pt recd 500 NS. Will give LR once completed

## 2023-03-03 NOTE — ED Triage Notes (Signed)
Pt reports he drank excessively last night and has nausea and vomiting so he is afraid he has alcohol poisoning.  Pt denies any abd pain and states he does not drink often.

## 2023-03-03 NOTE — ED Notes (Signed)
Noted order for PO challenge Reassessed pt Pt stated he has been drinking soda. Denies nausea Gave pt crackers since he is holding down soda Vitals listed

## 2023-03-03 NOTE — Discharge Instructions (Signed)
Make sure you continue to hydrate at home with frequent small sips of fluids.  Bland food as tolerated, high carb foods (bread, crackers) can help settle your stomach as you continue to recover from this hangover.

## 2023-03-03 NOTE — ED Notes (Signed)
Introduced self to pt Pt complains of ABD pain n/v/d that started today Pt stated that he drank more than he has ever drank before. IV access established Medicated per [facility] Pt aware that UA is requested Urinal at bedside

## 2023-03-03 NOTE — ED Provider Notes (Signed)
[place] [facility] Provider Note   CSN: [id number] Arrival date & time: 03/03/23  1402     History  Chief Complaint  Patient presents with   Emesis    Brendan Wilkinson is a 37 y.o. male presenting with nausea and vomiting which he woke up with this morning.  He is not a heavy drinker but states he drank excessively yesterday evening and suspects today symptoms are secondary to that.  He denies abdominal pain but has had some upper abdominal cramping and nausea.  He also endorses several episodes of diarrhea but has been predominantly complaining of too numerous to count episodes of vomiting, he denies hematemesis, his vomit has been yellow to clear.  He denies fevers or chills.  He has been unable to tolerate any p.o. intake today.  The history is provided by the patient.       Home Medications Prior to Admission medications   Medication Sig Start Date End Date Taking? Authorizing Provider  prochlorperazine (COMPAZINE) 10 MG tablet Take 1 tablet (10 mg total) by mouth 2 (two) times daily as needed for nausea or vomiting. 03/03/23  Yes Yoseline Andersson, Raynelle Fanning, PA-C  brompheniramine-pseudoephedrine-DM 30-2-10 MG/5ML syrup Take 5 mLs by mouth 4 (four) times daily as needed. 07/14/22   Leath-Warren, Sadie Haber, NP  chlorhexidine (PERIDEX) 0.12 % solution Use as directed 15 mLs in the mouth or throat 2 (two) times daily. 01/07/22   Particia Nearing, PA-C  clindamycin (CLEOCIN) 300 MG capsule Take 1 capsule (300 mg total) by mouth 2 (two) times daily. 01/07/22   Particia Nearing, PA-C  fluticasone [facility]) 50 MCG/ACT nasal spray Place 2 sprays into both nostrils daily. 07/14/22   Leath-Warren, Sadie Haber, NP  lidocaine (XYLOCAINE) 2 % solution Use as directed 10 mLs in the mouth or throat every 3 (three) hours as needed for mouth pain. 01/07/22   Particia Nearing, PA-C  pantoprazole (PROTONIX) 40 MG tablet TAKE 1 TABLET(40 MG) BY MOUTH DAILY 11/03/21    Carlan, Chelsea L, NP  Polyethyl Glycol-Propyl Glycol (LUBRICANT EYE DROPS) 0.4-0.3 % SOLN Place 1-2 drops into both eyes 3 (three) times daily as needed (dry/irritated eyes.).    [provider]  sucralfate (CARAFATE) 1 g tablet Take 1 tablet (1 g total) by mouth 4 (four) times daily -  with meals and at bedtime. 09/03/21   Carlan, Chelsea L, NP      Allergies    Amoxicillin, Azithromycin, Penicillins, and Sulfa antibiotics    Review of Systems   Review of Systems  Constitutional:  Positive for fatigue. Negative for chills and fever.  HENT: Negative.    Eyes: Negative.   Respiratory:  Negative for chest tightness and shortness of breath.   Cardiovascular:  Negative for chest pain.  Gastrointestinal:  Positive for diarrhea, nausea and vomiting. Negative for abdominal pain and blood in stool.  Genitourinary: Negative.   Musculoskeletal:  Negative for arthralgias, joint swelling and neck pain.  Skin: Negative.  Negative for rash and wound.  Neurological:  Negative for dizziness, light-headedness, numbness and headaches.  Psychiatric/Behavioral: Negative.    All other systems reviewed and are negative.   Physical Exam Updated Vital Signs BP 126/87 (BP Location: Left Arm)   Pulse (!) 56   Temp 97.8 F (36.6 C) (Oral)   Resp 14   Ht 6' (1.829 m)   Wt 106.6 kg   SpO2 98%   BMI 31.87 kg/m  Physical Exam Vitals and nursing  note reviewed.  Constitutional:      Appearance: He is well-developed.  HENT:     Head: Normocephalic and atraumatic.  Eyes:     Conjunctiva/sclera: Conjunctivae normal.  Cardiovascular:     Rate and Rhythm: Normal rate and regular rhythm.     Heart sounds: Normal heart sounds.  Pulmonary:     Effort: Pulmonary effort is normal.     Breath sounds: Normal breath sounds. No wheezing.  Abdominal:     General: Bowel sounds are normal.     Palpations: Abdomen is soft.     Tenderness: There is no abdominal tenderness.  Musculoskeletal:         General: Normal range of motion.     Cervical back: Normal range of motion.  Skin:    General: Skin is warm and dry.  Neurological:     Mental Status: He is alert.     ED Results / Procedures / Treatments   Labs (all labs ordered are listed, but only abnormal results are displayed) Labs Reviewed  COMPREHENSIVE METABOLIC PANEL - Abnormal; Notable for the following components:      Result Value   CO2 19 (*)    Glucose, Bld 114 (*)    Total Protein 9.0 (*)    Albumin 5.2 (*)    AST 42 (*)    Anion gap 17 (*)    All other components within normal limits  CBC - Abnormal; Notable for the following components:   WBC 14.1 (*)    MCHC 36.5 (*)    All other components within normal limits  LIPASE, BLOOD  ETHANOL  URINALYSIS, ROUTINE W REFLEX MICROSCOPIC    EKG None  Radiology No results found.  Procedures Procedures    Medications Ordered in ED Medications  sodium chloride 0.9 % bolus 1,000 mL (0 mLs Intravenous Stopped 03/03/23 1538)  ondansetron (ZOFRAN) injection 4 mg (4 mg Intravenous Given 03/03/23 1457)  prochlorperazine (COMPAZINE) injection 10 mg (10 mg Intravenous Given 03/03/23 1517)  diphenhydrAMINE (BENADRYL) injection 12.5 mg (12.5 mg Intravenous Given 03/03/23 1516)  lactated ringers bolus 1,000 mL (0 mLs Intravenous Stopped 03/03/23 1745)    ED Course/ Medical Decision Making/ A&P                             Medical Decision Making Patient presenting with nausea vomiting and diarrhea after consuming excessive EtOH last night, which is typically not his norm.  He was unable to keep any p.o. intake down today prior to arrival.  He describes epigastric pain, nausea and nonbloody vomiting.  Afebrile.  Patient is given IV fluids here, initially was given a dose of Zofran which resolved the vomiting but he still has complaint of nausea, Compazine and Benadryl were added after which he felt significantly improved.  A p.o. challenge was tried and he was able to tolerate  p.o.,  Amount and/or Complexity of Data Reviewed Labs: ordered.  Risk Prescription drug management.           Final Clinical Impression(s) / ED Diagnoses Final diagnoses:  Nausea and vomiting, unspecified vomiting type  Hangover without complication (HCC)    Rx / DC Orders ED Discharge Orders          Ordered    prochlorperazine (COMPAZINE) 10 MG tablet  2 times daily PRN        03/03/23 1749  Burgess Amor, PA-C 03/03/23 1751    Sloan Leiter, DO 03/05/23 [phone]

## 2023-03-05 ENCOUNTER — Encounter (HOSPITAL_COMMUNITY): Payer: Self-pay | Admitting: Emergency Medicine

## 2023-03-05 ENCOUNTER — Other Ambulatory Visit: Payer: Self-pay

## 2023-03-05 ENCOUNTER — Emergency Department (HOSPITAL_COMMUNITY)
Admission: EM | Admit: 2023-03-05 | Discharge: 2023-03-05 | Disposition: A | Discharge status: Home / Self Care | Payer: BLUE CROSS/BLUE SHIELD | Attending: Emergency Medicine | Admitting: Emergency Medicine

## 2023-03-05 ENCOUNTER — Emergency Department (HOSPITAL_COMMUNITY): Payer: BLUE CROSS/BLUE SHIELD

## 2023-03-05 DIAGNOSIS — E871 Hypo-osmolality and hyponatremia: Secondary | ICD-10-CM | POA: Diagnosis not present

## 2023-03-05 DIAGNOSIS — K219 Gastro-esophageal reflux disease without esophagitis: Secondary | ICD-10-CM | POA: Diagnosis not present

## 2023-03-05 DIAGNOSIS — Z79899 Other long term (current) drug therapy: Secondary | ICD-10-CM | POA: Diagnosis not present

## 2023-03-05 DIAGNOSIS — R112 Nausea with vomiting, unspecified: Secondary | ICD-10-CM

## 2023-03-05 DIAGNOSIS — R109 Unspecified abdominal pain: Secondary | ICD-10-CM | POA: Diagnosis present

## 2023-03-05 DIAGNOSIS — R1013 Epigastric pain: Secondary | ICD-10-CM | POA: Diagnosis not present

## 2023-03-05 DIAGNOSIS — E860 Dehydration: Secondary | ICD-10-CM | POA: Insufficient documentation

## 2023-03-05 LAB — CBC WITH DIFFERENTIAL/PLATELET
Abs Immature Granulocytes: 0.03 10*3/uL (ref 0.00–0.07)
Basophils Absolute: 0 10*3/uL (ref 0.0–0.1)
Basophils Relative: 1 %
Eosinophils Absolute: 0 10*3/uL (ref 0.0–0.5)
Eosinophils Relative: 0 %
HCT: 44 % (ref 39.0–52.0)
Hemoglobin: 15.5 g/dL (ref 13.0–17.0)
Immature Granulocytes: 0 %
Lymphocytes Relative: 12 %
Lymphs Abs: 1 10*3/uL (ref 0.7–4.0)
MCH: 31.3 pg (ref 26.0–34.0)
MCHC: 35.2 g/dL (ref 30.0–36.0)
MCV: 88.9 fL (ref 80.0–100.0)
Monocytes Absolute: 0.7 10*3/uL (ref 0.1–1.0)
Monocytes Relative: 8 %
Neutro Abs: 6.8 10*3/uL (ref 1.7–7.7)
Neutrophils Relative %: 79 %
Platelets: 325 10*3/uL (ref 150–400)
RBC: 4.95 MIL/uL (ref 4.22–5.81)
RDW: 12.7 % (ref 11.5–15.5)
WBC: 8.6 10*3/uL (ref 4.0–10.5)
nRBC: 0 % (ref 0.0–0.2)

## 2023-03-05 LAB — COMPREHENSIVE METABOLIC PANEL
ALT: 32 U/L (ref 0–44)
AST: 45 U/L — ABNORMAL HIGH (ref 15–41)
Albumin: 4.7 g/dL (ref 3.5–5.0)
Alkaline Phosphatase: 50 U/L (ref 38–126)
Anion gap: 11 (ref 5–15)
BUN: 22 mg/dL — ABNORMAL HIGH (ref 6–20)
CO2: 21 mmol/L — ABNORMAL LOW (ref 22–32)
Calcium: 9.4 mg/dL (ref 8.9–10.3)
Chloride: 101 mmol/L (ref 98–111)
Creatinine, Ser: 0.88 mg/dL (ref 0.61–1.24)
GFR, Estimated: >60 mL/min (ref >60)
Glucose, Bld: 121 mg/dL — ABNORMAL HIGH (ref 70–99)
Potassium: 3.5 mmol/L (ref 3.5–5.1)
Sodium: 133 mmol/L — ABNORMAL LOW (ref 135–145)
Total Bilirubin: 1.1 mg/dL (ref 0.3–1.2)
Total Protein: 8.3 g/dL — ABNORMAL HIGH (ref 6.5–8.1)

## 2023-03-05 LAB — LIPASE, BLOOD: Lipase: 38 U/L (ref 11–51)

## 2023-03-05 LAB — RAPID URINE DRUG SCREEN, HOSP PERFORMED
Amphetamines: NOT DETECTED
Barbiturates: NOT DETECTED
Benzodiazepines: NOT DETECTED
Cocaine: NOT DETECTED
Opiates: NOT DETECTED
Tetrahydrocannabinol: POSITIVE — AB

## 2023-03-05 MED ORDER — SUCRALFATE 1 G PO TABS: 1.0000 g | ORAL_TABLET | Freq: Three times a day (TID) | ORAL | 1 refills | Status: DC

## 2023-03-05 MED ORDER — SODIUM CHLORIDE 0.9 % IV BOLUS
1000.0000 mL | Freq: Once | INTRAVENOUS | Status: AC
  Administered 2023-03-05: 1000 mL via INTRAVENOUS

## 2023-03-05 MED ORDER — ALUM & MAG HYDROXIDE-SIMETH 200-200-20 MG/5ML PO SUSP
30.0000 mL | Freq: Once | ORAL | Status: AC
  Administered 2023-03-05: 30 mL via ORAL
  Filled 2023-03-05: qty 30

## 2023-03-05 MED ORDER — FENTANYL CITRATE PF 50 MCG/ML IJ SOSY
50.0000 ug | PREFILLED_SYRINGE | Freq: Once | INTRAMUSCULAR | Status: AC
  Administered 2023-03-05: 50 ug via INTRAVENOUS
  Filled 2023-03-05: qty 1

## 2023-03-05 MED ORDER — PROCHLORPERAZINE EDISYLATE 10 MG/2ML IJ SOLN
5.0000 mg | Freq: Once | INTRAMUSCULAR | Status: AC
  Administered 2023-03-05: 5 mg via INTRAVENOUS
  Filled 2023-03-05: qty 2

## 2023-03-05 MED ORDER — IOHEXOL 300 MG/ML  SOLN
100.0000 mL | Freq: Once | INTRAMUSCULAR | Status: AC | PRN
  Administered 2023-03-05: 100 mL via INTRAVENOUS

## 2023-03-05 MED ORDER — HALOPERIDOL LACTATE 5 MG/ML IJ SOLN
2.5000 mg | Freq: Once | INTRAMUSCULAR | Status: AC
  Administered 2023-03-05: 2.5 mg via INTRAVENOUS
  Filled 2023-03-05: qty 1

## 2023-03-05 MED ORDER — ONDANSETRON 4 MG PO TBDP: 4.0000 mg | ORAL_TABLET | Freq: Three times a day (TID) | ORAL | 0 refills | Status: DC | PRN

## 2023-03-05 MED ORDER — PANTOPRAZOLE SODIUM 40 MG PO TBEC: DELAYED_RELEASE_TABLET | ORAL | 1 refills | Status: DC

## 2023-03-05 NOTE — ED Notes (Signed)
Pt expressed frustration for time in the ED. Pt is yelling and using profane language with staff. Pt pacing in room. MD notified.

## 2023-03-05 NOTE — ED Provider Notes (Signed)
[facility] AT [facility] Provider Note   CSN: [id number] Arrival date & time: 03/05/23  1059     History  Chief Complaint  Patient presents with   Abdominal Pain    Brendan Wilkinson is a 37 y.o. male.   Abdominal Pain Patient presents abdominal pain.  Mid abdomen.  Nausea vomiting.  States was here 2 days ago.  Had nausea around that time and thought to be secondary to "alcohol poisoning.".  States did not pain at that time but now states more pain.  Upper abdomen.  No fevers or chills.    Past Medical History:  Diagnosis Date   GERD (gastroesophageal reflux disease)     Home Medications Prior to Admission medications   Medication Sig Start Date End Date Taking? Authorizing Provider  brompheniramine-pseudoephedrine-DM 30-2-10 MG/5ML syrup Take 5 mLs by mouth 4 (four) times daily as needed. 07/14/22   Leath-Warren, Sadie Haber, NP  chlorhexidine (PERIDEX) 0.12 % solution Use as directed 15 mLs in the mouth or throat 2 (two) times daily. 01/07/22   Particia Nearing, PA-C  clindamycin (CLEOCIN) 300 MG capsule Take 1 capsule (300 mg total) by mouth 2 (two) times daily. 01/07/22   Particia Nearing, PA-C  fluticasone [facility]) 50 MCG/ACT nasal spray Place 2 sprays into both nostrils daily. 07/14/22   Leath-Warren, Sadie Haber, NP  lidocaine (XYLOCAINE) 2 % solution Use as directed 10 mLs in the mouth or throat every 3 (three) hours as needed for mouth pain. 01/07/22   Particia Nearing, PA-C  pantoprazole (PROTONIX) 40 MG tablet TAKE 1 TABLET(40 MG) BY MOUTH DAILY 03/05/23   Benjiman Core, MD  Polyethyl Glycol-Propyl Glycol (LUBRICANT EYE DROPS) 0.4-0.3 % SOLN Place 1-2 drops into both eyes 3 (three) times daily as needed (dry/irritated eyes.).    [provider]  prochlorperazine (COMPAZINE) 10 MG tablet Take 1 tablet (10 mg total) by mouth 2 (two) times daily as needed for nausea or vomiting. 03/03/23   Burgess Amor, PA-C   sucralfate (CARAFATE) 1 g tablet Take 1 tablet (1 g total) by mouth 4 (four) times daily -  with meals and at bedtime. 03/05/23   Benjiman Core, MD      Allergies    Amoxicillin, Azithromycin, Penicillins, and Sulfa antibiotics    Review of Systems   Review of Systems  Gastrointestinal:  Positive for abdominal pain.    Physical Exam Updated Vital Signs BP (!) 159/99 (BP Location: Left Arm)   Pulse 81   Temp 98.3 F (36.8 C) (Oral)   Resp 19   SpO2 99%  Physical Exam Vitals and nursing note reviewed.  Cardiovascular:     Rate and Rhythm: Normal rate and regular rhythm.  Abdominal:     Tenderness: There is abdominal tenderness.     Comments: Upper abdominal tenderness without rebound or guarding.  No hernias palpated.  Skin:    Capillary Refill: Capillary refill takes less than 2 seconds.     ED Results / Procedures / Treatments   Labs (all labs ordered are listed, but only abnormal results are displayed) Labs Reviewed  COMPREHENSIVE METABOLIC PANEL - Abnormal; Notable for the following components:      Result Value   Sodium 133 (*)    CO2 21 (*)    Glucose, Bld 121 (*)    BUN 22 (*)    Total Protein 8.3 (*)    AST 45 (*)    All other components within normal limits  RAPID URINE DRUG SCREEN, HOSP PERFORMED - Abnormal; Notable for the following components:   Tetrahydrocannabinol POSITIVE (*)    All other components within normal limits  LIPASE, BLOOD  CBC WITH DIFFERENTIAL/PLATELET    EKG None  Radiology CT ABDOMEN PELVIS W CONTRAST  Result Date: 03/05/2023 CLINICAL DATA:  Left upper quadrant abdominal pain. Nausea vomiting. EXAM: CT ABDOMEN AND PELVIS WITH CONTRAST TECHNIQUE: Multidetector CT imaging of the abdomen and pelvis was performed using the standard protocol following bolus administration of intravenous contrast. RADIATION DOSE REDUCTION: This exam was performed according to the departmental dose-optimization program which includes automated exposure  control, adjustment of the mA and/or kV according to patient size and/or use of iterative reconstruction technique. CONTRAST:  OMNIPAQUE IOHEXOL 300 MG/ML  SOLN COMPARISON:  08/12/2021 FINDINGS: Lower chest: Unremarkable. Hepatobiliary: No suspicious focal abnormality within the liver parenchyma. There is no evidence for gallstones, gallbladder wall thickening, or pericholecystic fluid. No intrahepatic or extrahepatic biliary dilation. Pancreas: No focal mass lesion. No dilatation of the main duct. No intraparenchymal cyst. No peripancreatic edema. Spleen: No splenomegaly. No suspicious focal mass lesion. Adrenals/Urinary Tract: No adrenal nodule or mass. Kidneys unremarkable. No evidence for hydroureter. The urinary bladder appears normal for the degree of distention. Stomach/Bowel: Stomach is unremarkable. No gastric wall thickening. No evidence of outlet obstruction. Duodenum is normally positioned as is the ligament of Treitz. No small bowel wall thickening. No small bowel dilatation. The terminal ileum is normal. The appendix is normal. No gross colonic mass. No colonic wall thickening. Vascular/Lymphatic: No abdominal aortic aneurysm. No abdominal aortic atherosclerotic calcification. There is no gastrohepatic or hepatoduodenal ligament lymphadenopathy. No retroperitoneal or mesenteric lymphadenopathy. No pelvic sidewall lymphadenopathy. Reproductive: The prostate gland and seminal vesicles are unremarkable. Other: No intraperitoneal free fluid. Musculoskeletal: No worrisome lytic or sclerotic osseous abnormality. IMPRESSION: No acute findings in the abdomen or pelvis. Specifically, no findings to explain the patient's history of left upper quadrant pain. Electronically Signed   By: Kennith Center M.D.   On: 03/05/2023 15:04    Procedures Procedures    Medications Ordered in ED Medications  haloperidol lactate (HALDOL) injection 2.5 mg (has no administration in time range)  sodium chloride 0.9 %  bolus 1,000 mL (0 mLs Intravenous Stopped 03/05/23 1412)  alum & mag hydroxide-simeth (MAALOX/MYLANTA) 200-200-20 MG/5ML suspension 30 mL (30 mLs Oral Given 03/05/23 1154)  prochlorperazine (COMPAZINE) injection 5 mg (5 mg Intravenous Given 03/05/23 1154)  fentaNYL (SUBLIMAZE) injection 50 mcg (50 mcg Intravenous Given 03/05/23 1449)  iohexol (OMNIPAQUE) 300 MG/ML solution 100 mL (100 mLs Intravenous Contrast Given 03/05/23 1422)    ED Course/ Medical Decision Making/ A&P                             Medical Decision Making Amount and/or Complexity of Data Reviewed Labs: ordered. Radiology: ordered.  Risk OTC drugs. Prescription drug management.   Patient upper abdominal pain.  Began after heavy alcohol use.  Also uses marijuana.  Has had episodes of abdominal pain in the past.  Blood work reassuring.  Does have some dehydration.  Lipase normal.  Differential diagnosis includes gastritis, hiatal hernia, dehydration.  Mild hyponatremia.  Continued pain.  Given small dose of fentanyl.  CT scan done due to continued pain and reassuring.  Potentially could be alcoholic gastritis versus even other cause such as cannabinoid hyperemesis.  Will not give trial of Haldol.  Should be able to discharge home with GI  follow-up.  Has had some similar episodes in the past.  Care turned over to Dr. Hyacinth Meeker.           Final Clinical Impression(s) / ED Diagnoses Final diagnoses:  Epigastric pain    Rx / DC Orders ED Discharge Orders          Ordered    sucralfate (CARAFATE) 1 g tablet  3 times daily with meals & bedtime        03/05/23 1515    pantoprazole (PROTONIX) 40 MG tablet        03/05/23 1515              Benjiman Core, MD 03/05/23 1516

## 2023-03-05 NOTE — ED Provider Notes (Signed)
Patient accepted at change of shift, pending CT scan and lab results.  Labs and imaging are reassuring, the patient is positive for marijuana and continues to use, this is likely the cause of his recurrent epigastric pain nausea and vomiting, he was counseled on this at length and expresses understanding.  Home with Zofran, patient agreeable   Eber Hong, MD 03/05/23 [phone]

## 2023-03-05 NOTE — ED Notes (Signed)
Patient transported to CT 

## 2023-03-05 NOTE — Discharge Instructions (Addendum)
Testing has been reassuring, your symptoms are likely related to marijuana  Zofran is a medication which can help with nausea.  You may take 4 mg by mouth every 6 hours as needed if you are an adult, if your child under the age of 6 take half of a tablet or 2 mg every 6 hours as needed.  This should dissolve on your tongue within a short timeframe.  Wait about 30 minutes after taking it to help with drinking clear liquids.  Thank you for allowing [place] to treat you in the emergency department today.  After reviewing your examination and potential testing that was done it appears that you are safe to go home.  I would like for you to follow-up with your doctor within the next several days, have them obtain your results and follow-up with them to review all of these tests.  If you should develop severe or worsening symptoms return to the emergency department immediately

## 2023-03-05 NOTE — ED Triage Notes (Signed)
Pt reports severe mid abdominal pain that started this morning. Pt also reports n/v. Pt was here 2 days ago for what he thought was "alcohol poisoning."

## 2023-04-07 ENCOUNTER — Ambulatory Visit
Admission: EM | Admit: 2023-04-07 | Discharge: 2023-04-07 | Disposition: A | Discharge status: Home / Self Care | Payer: BLUE CROSS/BLUE SHIELD | Attending: Nurse Practitioner | Admitting: Nurse Practitioner

## 2023-04-07 ENCOUNTER — Encounter: Payer: Self-pay | Admitting: Emergency Medicine

## 2023-04-07 DIAGNOSIS — S61212A Laceration without foreign body of right middle finger without damage to nail, initial encounter: Secondary | ICD-10-CM

## 2023-04-07 NOTE — Discharge Instructions (Signed)
3 sutures were placed to right middle finger. Keep the dressing in place for 24 hours. Remove the dressing and clean the area with warm water in 24 hours.  When you are at home, may leave the area open to air.  When you are out, recommend keeping the area covered. May apply Neosporin to the laceration daily as needed. May take over-the-counter ibuprofen or Tylenol for pain or discomfort. Follow-up immediately if you develop swelling, increased redness that goes into the hand or up the arm, drainage, or if you develop fever, chills, or other concerns. Keep the sutures in place for 10 days.  You will follow-up on 04/17/2023 for suture removal.

## 2023-04-07 NOTE — ED Triage Notes (Signed)
Cut right middle finger on a sword today.  States tetanus is up to date.

## 2023-04-07 NOTE — ED Notes (Signed)
Patient socking hand in Hibiclens solution

## 2023-04-07 NOTE — ED Provider Notes (Signed)
[facility]    CSN: [id number] Arrival date & time: 04/07/23  1734      History   Chief Complaint No chief complaint on file.   HPI Brendan Wilkinson is a 37 y.o. male.   The history is provided by the patient and the spouse.   Patient presents with his spouse for a laceration to the right long finger.  Injury occurred approximately 1 to 2 hours prior to arrival.  Patient states he cut the joint of the right middle finger with a sword.  Bleeding is controlled at this time.  Patient denies any use of blood thinners.  Further denies numbness, tingling, or decrease sensitivity in the right long finger.  Last tetanus was June 2023.  Past Medical History:  Diagnosis Date   GERD (gastroesophageal reflux disease)     Patient Active Problem List   Diagnosis Date Noted   Abdominal pain, epigastric 09/03/2021   Nausea without vomiting 09/03/2021   Gastroesophageal reflux disease 09/03/2021   Diarrhea 09/03/2021    Past Surgical History:  Procedure Laterality Date   BIOPSY  09/11/2021   Procedure: BIOPSY;  Surgeon: Dolores Frame, MD;  Location: [facility];  Service: Gastroenterology;;   ESOPHAGOGASTRODUODENOSCOPY (EGD) WITH PROPOFOL N/A 09/11/2021   Procedure: ESOPHAGOGASTRODUODENOSCOPY (EGD) WITH PROPOFOL;  Surgeon: Dolores Frame, MD;  Location: [facility];  Service: Gastroenterology;  Laterality: N/A;  905   TONSILLECTOMY         Home Medications    Prior to Admission medications   Not on File    Family History History reviewed. No pertinent family history.  Social History Social History   Tobacco Use   Smoking status: Former   Smokeless tobacco: Former    Types: Associate Professor status: Never Used  Substance Use Topics   Alcohol use: Not Currently    Comment: occasionally   Drug use: Not Currently    Types: Marijuana    Comment: daily     Allergies   Amoxicillin, Azithromycin, Penicillins, and Sulfa  antibiotics   Review of Systems Review of Systems Per HPI  Physical Exam Triage Vital Signs ED Triage Vitals  Encounter Vitals Group     BP 04/07/23 1740 119/75     Systolic BP Percentile --      Diastolic BP Percentile --      Pulse Rate 04/07/23 1740 80     Resp 04/07/23 1740 16     Temp 04/07/23 1740 98 F (36.7 C)     Temp Source 04/07/23 1740 Oral     SpO2 04/07/23 1740 98 %     Weight --      Height --      Head Circumference --      Peak Flow --      Pain Score 04/07/23 1741 0     Pain Loc --      Pain Education --      Exclude from Growth Chart --    No data found.  Updated Vital Signs BP 119/75 (BP Location: Right Arm)   Pulse 80   Temp 98 F (36.7 C) (Oral)   Resp 16   SpO2 98%   Visual Acuity Right Eye Distance:   Left Eye Distance:   Bilateral Distance:    Right Eye Near:   Left Eye Near:    Bilateral Near:     Physical Exam Vitals and nursing note reviewed.  Constitutional:  General: He is not in acute distress.    Appearance: Normal appearance.  HENT:     Head: Normocephalic.  Eyes:     Extraocular Movements: Extraocular movements intact.     Pupils: Pupils are equal, round, and reactive to light.  Pulmonary:     Effort: Pulmonary effort is normal.  Musculoskeletal:     Cervical back: Normal range of motion.  Skin:    General: Skin is warm and dry.     Findings: Laceration present.     Comments: Linear laceration noted to the palmar aspect of the right long finger at the proximal interphalangeal joint.  Bleeding is controlled at this time.  Laceration measures approximately 1 cm in length. Cap refill > 2 sec, neurovascular status is intact.  Neurological:     General: No focal deficit present.     Mental Status: He is alert and oriented to person, place, and time.  Psychiatric:        Mood and Affect: Mood normal.        Behavior: Behavior normal.      UC Treatments / Results  Labs (all labs ordered are listed, but only  abnormal results are displayed) Labs Reviewed - No data to display  EKG   Radiology No results found.  Procedures Laceration Repair  Date/Time: 04/07/2023 6:16 PM  Performed by: Abran Cantor, NP Authorized by: Abran Cantor, NP   Consent:    Consent obtained:  Verbal   Consent given by:  Patient   Risks, benefits, and alternatives were discussed: yes     Risks discussed:  Infection, poor cosmetic result, poor wound healing and need for additional repair Universal protocol:    Procedure explained and questions answered to patient or proxy's satisfaction: yes     Patient identity confirmed:  Verbally with patient Anesthesia:    Anesthesia method:  Local infiltration   Local anesthetic:  Lidocaine 2% w/o epi Laceration details:    Location:  Finger   Finger location:  R long finger   Wound length (cm): 1. Pre-procedure details:    Preparation:  Patient was prepped and draped in usual sterile fashion Exploration:    Limited defect created (wound extended): yes     Wound extent: no foreign bodies/material noted, no nerve damage noted and no tendon damage noted     Contaminated: no   Treatment:    Wound cleansed with: Hibiclens soap and sterile water.   Amount of cleaning:  Standard   Irrigation solution:  Sterile water   Irrigation method:  Tap Skin repair:    Repair method:  Sutures   Suture size:  3-0   Suture technique:  Simple interrupted   Number of sutures:  3 Approximation:    Approximation:  Loose Repair type:    Repair type:  Simple Post-procedure details:    Dressing:  Antibiotic ointment and non-adherent dressing   Procedure completion:  Tolerated well, no immediate complications Comments:     1 cm laceration noted to the proximal interphalangeal joint of the right long finger.  Area was cleansed with Hibiclens and sterile water.  Lidocaine 2% without epi-2 mL used for anesthesia of the joint.  3 simple interrupted sutures placed.   Patient tolerated the procedure well.  Area was cleansed with sterile water.  Antibiotic ointment with nonadherent dressing and Coban applied.  (including critical care time)  Medications Ordered in UC Medications - No data to display  Initial Impression / Assessment and Plan /  UC Course  I have reviewed the triage vital signs and the nursing notes.  Pertinent labs & imaging results that were available during my care of the patient were reviewed by me and considered in my medical decision making (see chart for details).  Patient with 1 cm laceration noted to the proximal interphalangeal joint of the right long finger.  Last Tdap was June 2023.  3 simple interrupted sutures were placed without difficulty.  Patient tolerated the procedure well.  Supportive care recommendations were provided and discussed with the patient to include over-the-counter analgesics for pain or discomfort, keeping the area clean and dry, along with indications for follow-up if area becomes infected.  Will have patient follow-up in 10 days for suture removal.  Patient is in agreement with this plan of care and verbalizes understanding.  All questions were answered.  Patient stable for discharge.  Final Clinical Impressions(s) / UC Diagnoses   Final diagnoses:  Laceration of right middle finger without foreign body without damage to nail, initial encounter     Discharge Instructions      3 sutures were placed to right middle finger. Keep the dressing in place for 24 hours. Remove the dressing and clean the area with warm water in 24 hours.  When you are at home, may leave the area open to air.  When you are out, recommend keeping the area covered. May apply Neosporin to the laceration daily as needed. May take over-the-counter ibuprofen or Tylenol for pain or discomfort. Follow-up immediately if you develop swelling, increased redness that goes into the hand or up the arm, drainage, or if you develop fever, chills,  or other concerns. Keep the sutures in place for 10 days.  You will follow-up on 04/17/2023 for suture removal.      ED Prescriptions   None    PDMP not reviewed this encounter.   Abran Cantor, NP 04/07/23 1827

## 2023-09-28 ENCOUNTER — Ambulatory Visit
Admission: EM | Admit: 2023-09-28 | Discharge: 2023-09-28 | Disposition: A | Discharge status: Home / Self Care | Payer: BLUE CROSS/BLUE SHIELD | Attending: Family Medicine | Admitting: Family Medicine

## 2023-09-28 ENCOUNTER — Encounter: Payer: Self-pay | Admitting: Emergency Medicine

## 2023-09-28 DIAGNOSIS — J069 Acute upper respiratory infection, unspecified: Secondary | ICD-10-CM | POA: Diagnosis not present

## 2023-09-28 LAB — POCT INFLUENZA A/B
Influenza A, POC: NEGATIVE
Influenza B, POC: NEGATIVE

## 2023-09-28 MED ORDER — FLUTICASONE PROPIONATE 50 MCG/ACT NA SUSP: 1.0000 | Freq: Two times a day (BID) | NASAL | 2 refills | Status: AC

## 2023-09-28 MED ORDER — PROMETHAZINE-DM 6.25-15 MG/5ML PO SYRP: 5.0000 mL | ORAL_SOLUTION | Freq: Four times a day (QID) | ORAL | 0 refills | Status: AC | PRN

## 2023-09-28 NOTE — Discharge Instructions (Addendum)
Your flu test was negative, your COVID test to be back tomorrow and someone will call if anything comes back positive.  I have sent over some medications to help with symptoms and you may also continue taking cold and congestion medications, over-the-counter pain relievers

## 2023-09-28 NOTE — ED Triage Notes (Signed)
Sore throat, feels weak and dizzy since yesterday.  Fever and body aches last night.

## 2023-09-28 NOTE — ED Provider Notes (Signed)
[facility]    CSN: [id number] Arrival date & time: 09/28/23  1159      History   Chief Complaint No chief complaint on file.   HPI Brendan Wilkinson is a 38 y.o. male.   Presenting today with 1 day history of sore throat, weakness, dizziness, fever, body aches, fatigue.  Denies chest pain, shortness of breath, abdominal pain, vomiting, diarrhea.  So far trying ibuprofen with minimal relief.    Past Medical History:  Diagnosis Date   GERD (gastroesophageal reflux disease)     Patient Active Problem List   Diagnosis Date Noted   Abdominal pain, epigastric 09/03/2021   Nausea without vomiting 09/03/2021   Gastroesophageal reflux disease 09/03/2021   Diarrhea 09/03/2021    Past Surgical History:  Procedure Laterality Date   BIOPSY  09/11/2021   Procedure: BIOPSY;  Surgeon: Dolores Frame, MD;  Location: [facility];  Service: Gastroenterology;;   ESOPHAGOGASTRODUODENOSCOPY (EGD) WITH PROPOFOL N/A 09/11/2021   Procedure: ESOPHAGOGASTRODUODENOSCOPY (EGD) WITH PROPOFOL;  Surgeon: Dolores Frame, MD;  Location: [facility];  Service: Gastroenterology;  Laterality: N/A;  905   TONSILLECTOMY         Home Medications    Prior to Admission medications   Medication Sig Start Date End Date Taking? Authorizing Provider  fluticasone (FLONASE) 50 MCG/ACT nasal spray Place 1 spray into both nostrils 2 (two) times daily. 09/28/23  Yes Particia Nearing, PA-C  promethazine-dextromethorphan (PROMETHAZINE-DM) 6.25-15 MG/5ML syrup Take 5 mLs by mouth 4 (four) times daily as needed. 09/28/23  Yes Particia Nearing, PA-C    Family History History reviewed. No pertinent family history.  Social History Social History   Tobacco Use   Smoking status: Former   Smokeless tobacco: Former    Types: Associate Professor status: Never Used  Substance Use Topics   Alcohol use: Not Currently    Comment: occasionally   Drug use: Not  Currently    Types: Marijuana    Comment: daily     Allergies   Amoxicillin, Azithromycin, Penicillins, and Sulfa antibiotics   Review of Systems Review of Systems Per HPI  Physical Exam Triage Vital Signs ED Triage Vitals  Encounter Vitals Group     BP 09/28/23 1209 122/77     Systolic BP Percentile --      Diastolic BP Percentile --      Pulse Rate 09/28/23 1209 80     Resp 09/28/23 1209 20     Temp 09/28/23 1209 98.1 F (36.7 C)     Temp Source 09/28/23 1209 Oral     SpO2 09/28/23 1209 98 %     Weight --      Height --      Head Circumference --      Peak Flow --      Pain Score 09/28/23 1211 4     Pain Loc --      Pain Education --      Exclude from Growth Chart --    No data found.  Updated Vital Signs BP 122/77 (BP Location: Right Arm)   Pulse 80   Temp 98.1 F (36.7 C) (Oral)   Resp 20   SpO2 98%   Visual Acuity Right Eye Distance:   Left Eye Distance:   Bilateral Distance:    Right Eye Near:   Left Eye Near:    Bilateral Near:     Physical Exam Vitals and nursing note reviewed.  Constitutional:      Appearance: He is well-developed.  HENT:     Head: Atraumatic.     Right Ear: External ear normal.     Left Ear: External ear normal.     Nose: Rhinorrhea present.     Mouth/Throat:     Pharynx: Posterior oropharyngeal erythema present. No oropharyngeal exudate.  Eyes:     Conjunctiva/sclera: Conjunctivae normal.     Pupils: Pupils are equal, round, and reactive to light.  Cardiovascular:     Rate and Rhythm: Normal rate and regular rhythm.  Pulmonary:     Effort: Pulmonary effort is normal. No respiratory distress.     Breath sounds: No wheezing or rales.  Musculoskeletal:        General: Normal range of motion.     Cervical back: Normal range of motion and neck supple.  Lymphadenopathy:     Cervical: No cervical adenopathy.  Skin:    General: Skin is warm and dry.  Neurological:     Mental Status: He is alert and oriented to  person, place, and time.  Psychiatric:        Behavior: Behavior normal.      UC Treatments / Results  Labs (all labs ordered are listed, but only abnormal results are displayed) Labs Reviewed  SARS CORONAVIRUS 2 (TAT 6-24 HRS)  POCT INFLUENZA A/B    EKG   Radiology No results found.  Procedures Procedures (including critical care time)  Medications Ordered in UC Medications - No data to display  Initial Impression / Assessment and Plan / UC Course  I have reviewed the triage vital signs and the nursing notes.  Pertinent labs & imaging results that were available during my care of the patient were reviewed by me and considered in my medical decision making (see chart for details).     Vitals and exam reassuring today, rapid flu negative, COVID testing pending.  Suspect viral respiratory infection.  Discussed supportive over-the-counter medications, home care, Phenergan DM, Flonase.  Work note given.  Return for worsening symptoms.  Final Clinical Impressions(s) / UC Diagnoses   Final diagnoses:  Viral URI with cough     Discharge Instructions      Your flu test was negative, your COVID test to be back tomorrow and someone will call if anything comes back positive.  I have sent over some medications to help with symptoms and you may also continue taking cold and congestion medications, over-the-counter pain relievers    ED Prescriptions     Medication Sig Dispense Auth. Provider   promethazine-dextromethorphan (PROMETHAZINE-DM) 6.25-15 MG/5ML syrup Take 5 mLs by mouth 4 (four) times daily as needed. 100 mL Particia Nearing, PA-C   fluticasone [facility]) 50 MCG/ACT nasal spray Place 1 spray into both nostrils 2 (two) times daily. 16 g Particia Nearing, [place]      PDMP not reviewed this encounter.   Particia Nearing, [place] 09/28/23 1351

## 2023-09-29 LAB — SARS CORONAVIRUS 2 (TAT 6-24 HRS): SARS Coronavirus 2: POSITIVE — AB

## 2023-11-18 ENCOUNTER — Ambulatory Visit
Admission: EM | Admit: 2023-11-18 | Discharge: 2023-11-18 | Disposition: A | Discharge status: Home / Self Care | Attending: Family Medicine | Admitting: Family Medicine

## 2023-11-18 DIAGNOSIS — R062 Wheezing: Secondary | ICD-10-CM | POA: Diagnosis not present

## 2023-11-18 DIAGNOSIS — R051 Acute cough: Secondary | ICD-10-CM

## 2023-11-18 MED ORDER — ALBUTEROL SULFATE HFA 108 (90 BASE) MCG/ACT IN AERS: 1.0000 | INHALATION_SPRAY | Freq: Four times a day (QID) | RESPIRATORY_TRACT | 1 refills | Status: AC | PRN

## 2023-11-18 MED ORDER — PREDNISONE 20 MG PO TABS: 40.0000 mg | ORAL_TABLET | Freq: Every day | ORAL | 0 refills | Status: AC

## 2023-11-18 MED ORDER — DOXYCYCLINE HYCLATE 100 MG PO CAPS: 100.0000 mg | ORAL_CAPSULE | Freq: Two times a day (BID) | ORAL | 0 refills | Status: AC

## 2023-11-18 NOTE — ED Provider Notes (Signed)
 [facility]   [id number] 11/18/23 Arrival Time: 0801  ASSESSMENT & PLAN:  1. Acute cough   2. Wheezing    Denies SOB. No indication for chest imaging at this time. Given duration and current symptoms will tx with: Meds ordered this encounter  Medications   albuterol (VENTOLIN HFA) 108 (90 Base) MCG/ACT inhaler    Sig: Inhale 1-2 puffs into the lungs every 6 (six) hours as needed for wheezing or shortness of breath.    Dispense:  1 each    Refill:  1   predniSONE (DELTASONE) 20 MG tablet    Sig: Take 2 tablets (40 mg total) by mouth daily.    Dispense:  10 tablet    Refill:  0   doxycycline (VIBRAMYCIN) 100 MG capsule    Sig: Take 1 capsule (100 mg total) by mouth 2 (two) times daily.    Dispense:  14 capsule    Refill:  0   OTC symptom care as needed.   Follow-up Information     [facility].   Specialty: Urgent Care Why: If worsening or failing to improve as anticipated. Contact information: [address] [place] [address] [phone]                Reviewed expectations re: course of current medical issues. Questions answered. Outlined signs and symptoms indicating need for more acute intervention. Understanding verbalized. After Visit Summary given.   SUBJECTIVE: History from: Patient. Brendan Wilkinson is a 37 y.o. male. Pt reports he has a cough, some nasal congestion, and wheezing x one week. Wheezing more now. Still with post-nasal drainage. Denies fever. Normal PO intake without n/v/d.  OBJECTIVE:  Vitals:   11/18/23 0806  BP: 120/76  Pulse: 90  Resp: 18  Temp: (!) 97.4 F (36.3 C)  SpO2: 97%    General appearance: alert; no distress Eyes: PERRLA; EOMI; conjunctiva normal HENT: [place]; AT; with nasal congestion Neck: supple  Lungs: speaks full sentences without difficulty; unlabored; significant bilat exp wheezing Extremities: no edema Skin: warm and dry Neurologic:  normal gait Psychological: alert and cooperative; normal mood and affect  Labs:  Labs Reviewed - No data to display  Imaging: No results found.  Allergies  Allergen Reactions   Amoxicillin     unknown   Azithromycin     Body rash    Penicillins Other (See Comments)    Reaction=unknown   Sulfa Antibiotics Other (See Comments)    Reaction=unknown    Past Medical History:  Diagnosis Date   GERD (gastroesophageal reflux disease)    Social History   Socioeconomic History   Marital status: Married    Spouse name: Not on file   Number of children: Not on file   Years of education: Not on file   Highest education level: Not on file  Occupational History   Not on file  Tobacco Use   Smoking status: Former   Smokeless tobacco: Former    Types: Engineer, drilling   Vaping status: Never Used  Substance and Sexual Activity   Alcohol use: Not Currently    Comment: occasionally   Drug use: Not Currently    Types: Marijuana    Comment: daily   Sexual activity: Not on file  Other Topics Concern   Not on file  Social History Narrative   Not on file   Social Drivers of Health   Financial Resource Strain: Not on file  Food  Insecurity: Not on file  Transportation Needs: Not on file  Physical Activity: Not on file  Stress: Not on file  Social Connections: Not on file  Intimate Partner Violence: Not on file   History reviewed. No pertinent family history. Past Surgical History:  Procedure Laterality Date   BIOPSY  09/11/2021   Procedure: BIOPSY;  Surgeon: Marguerita Merles, Reuel Boom, MD;  Location: [facility];  Service: Gastroenterology;;   ESOPHAGOGASTRODUODENOSCOPY (EGD) WITH PROPOFOL N/A 09/11/2021   Procedure: ESOPHAGOGASTRODUODENOSCOPY (EGD) WITH PROPOFOL;  Surgeon: Dolores Frame, MD;  Location: [facility];  Service: Gastroenterology;  Laterality: N/A;  784   TONSILLECTOMY       Mardella Layman, MD 11/18/23 [phone]

## 2023-11-18 NOTE — ED Triage Notes (Signed)
 Pt reports he has a cough, some nasal congestion, and wheezing x 5 days

## 2024-01-28 ENCOUNTER — Other Ambulatory Visit: Payer: Self-pay | Admitting: Family Medicine

## 2024-06-29 ENCOUNTER — Telehealth: Admitting: Family Medicine

## 2024-06-29 DIAGNOSIS — H44002 Unspecified purulent endophthalmitis, left eye: Secondary | ICD-10-CM

## 2024-06-29 MED ORDER — POLYMYXIN B-TRIMETHOPRIM 10000-0.1 UNIT/ML-% OP SOLN: 1.0000 [drp] | Freq: Four times a day (QID) | OPHTHALMIC | 0 refills | Status: AC

## 2024-06-29 NOTE — Progress Notes (Signed)
 Virtual Visit Consent   Brendan Wilkinson, you are scheduled for a virtual visit with a [facility] provider today. Just as with appointments in the office, your consent must be obtained to participate. Your consent will be active for this visit and any virtual visit you may have with one of our providers in the next 365 days. If you have a MyChart account, a copy of this consent can be sent to you electronically.  As this is a virtual visit, video technology does not allow for your provider to perform a traditional examination. This may limit your provider's ability to fully assess your condition. If your provider identifies any concerns that need to be evaluated in person or the need to arrange testing (such as labs, EKG, etc.), we will make arrangements to do so. Although advances in technology are sophisticated, we cannot ensure that it will always work on either your end or our end. If the connection with a video visit is poor, the visit may have to be switched to a telephone visit. With either a video or telephone visit, we are not always able to ensure that we have a secure connection.  By engaging in this virtual visit, you consent to the provision of healthcare and authorize for your insurance to be billed (if applicable) for the services provided during this visit. Depending on your insurance coverage, you may receive a charge related to this service.  I need to obtain your verbal consent now. Are you willing to proceed with your visit today? Brendan Wilkinson has provided verbal consent on 06/29/2024 for a virtual visit (video or telephone). Brendan CHRISTELLA Barefoot, NP  Date: 06/29/2024 10:16 AM   Virtual Visit via Video Note   I, Brendan Wilkinson, connected with  Brendan Wilkinson  ([id number], [birth year]) on 06/29/24 at 10:15 AM EDT by a video-enabled telemedicine application and verified that I am speaking with the correct person using two identifiers.  Location: Patient: Virtual Visit Location  Patient: Home Provider: Virtual Visit Location Provider: Home Office   I discussed the limitations of evaluation and management by telemedicine and the availability of in person appointments. The patient expressed understanding and agreed to proceed.    History of Present Illness: Brendan Wilkinson is a 38 y.o. who identifies as a male who was assigned male at birth, and is being seen today for pink eye.  Onset was week ago with material he works with flew into eye. Eyes were washed out well. Eye was overall were good. But son told him his eye looked bad and his left eye was pink. It is itchy with some drainage- crusty at times. Modifying factors are visine eye drops Denies URI- not current, fevers, chills, redness of eye lid or swelling, pain with eye movement, headaches, vision changes   Problems:  Patient Active Problem List   Diagnosis Date Noted   Abdominal pain, epigastric 09/03/2021   Nausea without vomiting 09/03/2021   Gastroesophageal reflux disease 09/03/2021   Diarrhea 09/03/2021    Allergies:  Allergies  Allergen Reactions   Amoxicillin     unknown   Azithromycin     Body rash    Penicillins Other (See Comments)    Reaction=unknown   Sulfa Antibiotics Other (See Comments)    Reaction=unknown   Medications:  Current Outpatient Medications:    albuterol  (VENTOLIN  HFA) 108 (90 Base) MCG/ACT inhaler, Inhale 1-2 puffs into the lungs every 6 (six) hours as needed for wheezing or shortness of breath.,  Disp: 1 each, Rfl: 1   doxycycline  (VIBRAMYCIN ) 100 MG capsule, Take 1 capsule (100 mg total) by mouth 2 (two) times daily., Disp: 14 capsule, Rfl: 0   fluticasone  (FLONASE ) 50 MCG/ACT nasal spray, Place 1 spray into both nostrils 2 (two) times daily., Disp: 16 g, Rfl: 2   predniSONE  (DELTASONE ) 20 MG tablet, Take 2 tablets (40 mg total) by mouth daily., Disp: 10 tablet, Rfl: 0   promethazine -dextromethorphan (PROMETHAZINE -DM) 6.25-15 MG/5ML syrup, Take 5 mLs by mouth 4  (four) times daily as needed., Disp: 100 mL, Rfl: 0  Observations/Objective: Patient is well-developed, well-nourished in no acute distress.  Resting comfortably  at home.  Head is normocephalic, atraumatic.  No labored breathing.  Speech is clear and coherent with logical content.  Patient is alert and oriented at baseline.    Assessment and Plan:  1. Eye infection, left (Primary)  - trimethoprim-polymyxin b (POLYTRIM) ophthalmic solution; Place 1 drop into the right eye in the morning, at noon, in the evening, and at bedtime.  Dispense: 10 mL; Refill: 0    -Use eye medication as ordered -Advised to change pillowcase after 2-3 days of medication use to avoid reinfection -Keep eye clean and dry -Avoid rubbing -Wash hands frequently -Warm compresses used prior to eye medication -Follow up in person if not improving in next 24-48 hours of starting medication, if swelling occurs, or develop vision changes or worsening blurry vision. -Tylenol  for any discomfort  Reviewed side effects, risks and benefits of medication.    Patient acknowledged agreement and understanding of the plan.   Past Medical, Surgical, Social History, Allergies, and Medications have been Reviewed.   Follow Up Instructions: I discussed the assessment and treatment plan with the patient. The patient was provided an opportunity to ask questions and all were answered. The patient agreed with the plan and demonstrated an understanding of the instructions.  A copy of instructions were sent to the patient via MyChart unless otherwise noted below.    The patient was advised to call back or seek an in-person evaluation if the symptoms worsen or if the condition fails to improve as anticipated.    Brendan CHRISTELLA Barefoot, NP

## 2024-06-29 NOTE — Patient Instructions (Signed)
  Brendan Wilkinson, thank you for joining [name], NP for today's virtual visit.  While this provider is not your primary care provider (PCP), if your PCP is located in our provider database this encounter information will be shared with them immediately following your visit.   A [facility] MyChart account gives you access to today's visit and all your visits, tests, and labs performed at [facility]  click here if you don't have a [facility] MyChart account or go to [email or web address]  Consent: (Patient) Brendan Wilkinson provided verbal consent for this virtual visit at the beginning of the encounter.  Current Medications:  Current Outpatient Medications:    trimethoprim-polymyxin b (POLYTRIM) ophthalmic solution, Place 1 drop into the right eye in the morning, at noon, in the evening, and at bedtime., Disp: 10 mL, Rfl: 0   albuterol  (VENTOLIN  HFA) 108 (90 Base) MCG/ACT inhaler, Inhale 1-2 puffs into the lungs every 6 (six) hours as needed for wheezing or shortness of breath., Disp: 1 each, Rfl: 1   doxycycline  (VIBRAMYCIN ) 100 MG capsule, Take 1 capsule (100 mg total) by mouth 2 (two) times daily., Disp: 14 capsule, Rfl: 0   fluticasone  (FLONASE ) 50 MCG/ACT nasal spray, Place 1 spray into both nostrils 2 (two) times daily., Disp: 16 g, Rfl: 2   predniSONE  (DELTASONE ) 20 MG tablet, Take 2 tablets (40 mg total) by mouth daily., Disp: 10 tablet, Rfl: 0   promethazine -dextromethorphan (PROMETHAZINE -DM) 6.25-15 MG/5ML syrup, Take 5 mLs by mouth 4 (four) times daily as needed., Disp: 100 mL, Rfl: 0   Medications ordered in this encounter:  Meds ordered this encounter  Medications   trimethoprim-polymyxin b (POLYTRIM) ophthalmic solution    Sig: Place 1 drop into the right eye in the morning, at noon, in the evening, and at bedtime.    Dispense:  10 mL    Refill:  0    Supervising Provider:   BLAISE ALEENE KIDD [[id number]]     *If you need refills on other  medications prior to your next appointment, please contact your pharmacy*  Follow-Up: Call back or seek an in-person evaluation if the symptoms worsen or if the condition fails to improve as anticipated.  [facility] [phone]  Other Instructions   -Use eye medication as ordered -Advised to change pillowcase after 2-3 days of medication use to avoid reinfection -Keep eye clean and dry -Avoid rubbing -Wash hands frequently -Warm compresses used prior to eye medication -Follow up in person if not improving in next 24-48 hours of starting medication, if swelling occurs, or develop vision changes or worsening blurry vision. -Tylenol  for any discomfort    If you have been instructed to have an in-person evaluation today at a local Urgent Care facility, please use the link below. It will take you to a list of all of our available [facility], including address, phone number and hours of operation. Please do not delay care.  [facility]  If you or a family member do not have a primary care provider, use the link below to schedule a visit and establish care. When you choose a [facility] primary care physician or advanced practice provider, you gain a long-term partner in health. Find a Primary Care Provider  Learn more about [facility] in-office and virtual care options:  - Get Care Now
